# Patient Record
Sex: Male | Born: 1965 | Race: White | Hispanic: No | Marital: Married | Smoking: Never smoker
Health system: Southern US, Community
[De-identification: ages and names within clinical notes are randomized; demographics above are authoritative.]

## PROBLEM LIST (undated history)

## (undated) DIAGNOSIS — E559 Vitamin D deficiency, unspecified: Secondary | ICD-10-CM

## (undated) DIAGNOSIS — E785 Hyperlipidemia, unspecified: Secondary | ICD-10-CM

## (undated) DIAGNOSIS — E291 Testicular hypofunction: Secondary | ICD-10-CM

## (undated) DIAGNOSIS — F32A Depression, unspecified: Secondary | ICD-10-CM

## (undated) DIAGNOSIS — R7303 Prediabetes: Secondary | ICD-10-CM

## (undated) DIAGNOSIS — E039 Hypothyroidism, unspecified: Secondary | ICD-10-CM

## (undated) DIAGNOSIS — I10 Essential (primary) hypertension: Secondary | ICD-10-CM

## (undated) DIAGNOSIS — F329 Major depressive disorder, single episode, unspecified: Secondary | ICD-10-CM

## (undated) HISTORY — DX: Prediabetes: R73.03

## (undated) HISTORY — DX: Hypothyroidism, unspecified: E03.9

## (undated) HISTORY — DX: Hyperlipidemia, unspecified: E78.5

## (undated) HISTORY — DX: Depression, unspecified: F32.A

## (undated) HISTORY — DX: Testicular hypofunction: E29.1

## (undated) HISTORY — DX: Essential (primary) hypertension: I10

## (undated) HISTORY — DX: Vitamin D deficiency, unspecified: E55.9

## (undated) HISTORY — DX: Major depressive disorder, single episode, unspecified: F32.9

---

## 2004-09-17 ENCOUNTER — Encounter (HOSPITAL_COMMUNITY): Admission: RE | Admit: 2004-09-17 | Discharge: 2004-12-16 | Payer: Self-pay | Admitting: Internal Medicine

## 2005-08-27 ENCOUNTER — Emergency Department (HOSPITAL_COMMUNITY): Admission: EM | Admit: 2005-08-27 | Discharge: 2005-08-27 | Payer: Self-pay | Admitting: Emergency Medicine

## 2010-07-14 ENCOUNTER — Encounter: Payer: Self-pay | Admitting: Internal Medicine

## 2013-05-09 ENCOUNTER — Ambulatory Visit: Payer: BC Managed Care – PPO

## 2013-05-11 ENCOUNTER — Encounter: Payer: Self-pay | Admitting: Internal Medicine

## 2013-05-12 ENCOUNTER — Other Ambulatory Visit: Payer: Self-pay | Admitting: Internal Medicine

## 2013-05-12 ENCOUNTER — Encounter: Payer: Self-pay | Admitting: Internal Medicine

## 2013-05-12 ENCOUNTER — Ambulatory Visit: Payer: BC Managed Care – PPO | Admitting: Internal Medicine

## 2013-05-12 VITALS — BP 116/84 | HR 80 | Temp 97.3°F | Resp 18 | Ht 69.0 in | Wt 237.0 lb

## 2013-05-12 DIAGNOSIS — E559 Vitamin D deficiency, unspecified: Secondary | ICD-10-CM | POA: Insufficient documentation

## 2013-05-12 DIAGNOSIS — E119 Type 2 diabetes mellitus without complications: Secondary | ICD-10-CM

## 2013-05-12 DIAGNOSIS — E291 Testicular hypofunction: Secondary | ICD-10-CM

## 2013-05-12 DIAGNOSIS — E1122 Type 2 diabetes mellitus with diabetic chronic kidney disease: Secondary | ICD-10-CM | POA: Insufficient documentation

## 2013-05-12 LAB — HEMOGLOBIN A1C: Mean Plasma Glucose: 134 mg/dL — ABNORMAL HIGH (ref <117)

## 2013-05-12 MED ORDER — TESTOSTERONE CYPIONATE 200 MG/ML IM SOLN
300.0000 mg | Freq: Once | INTRAMUSCULAR | Status: AC
  Administered 2013-05-12: 300 mg via INTRAMUSCULAR

## 2013-05-12 MED ORDER — ERGOCALCIFEROL 1.25 MG (50000 UT) PO CAPS: ORAL_CAPSULE | ORAL | Status: DC

## 2013-05-12 NOTE — Progress Notes (Signed)
Patient ID: Juan Campos, male   DOB: [birth year], 47 y.o.   MRN: [id number]   This very nice 47 yo MWM presents for 1 month follow up diabetes and vitamin D deficiency.      Also, the patient has history of prediabetes/insulin resistance wi last A1c of    . Patient denies any symptoms of reactive hypoglycemia, diabetic polys, paresthesias or visual blurring.   Also, the patient's diabetes has been controlled with diet and medications. Last A1c rising from 6.1% -> 7.2% with elevated insulin level of 113 (nl<28). Patient was started on Metformin And also phentermine for weight loss and has lost from 256# -> 237 # (19 # loss!) over the last month. . Patient denies any symptoms of reactive hypoglycemia, diabetic polys, paresthesias or visual blurring.   Further, Patient has history of vitamin D deficiency with last vitamin D of 31 and was started on higher dose of Vit D2 1.25mg /da  . Patient supplements vitamin D without any suspected side-effects.  Current Outpatient Prescriptions on File Prior to Visit  Medication Sig Dispense Refill  . ALPRAZolam (XANAX) 1 MG tablet Take 1 mg by mouth at bedtime as needed for anxiety.      Marland Kitchen atenolol (TENORMIN) 100 MG tablet Take 100 mg by mouth daily.      Marland Kitchen atorvastatin (LIPITOR) 40 MG tablet Take 40 mg by mouth daily. Take 1/2 tablet daily.      Marland Kitchen buPROPion (WELLBUTRIN XL) 300 MG 24 hr tablet Take 300 mg by mouth daily.      . fenofibrate micronized (LOFIBRA) 134 MG capsule Take 134 mg by mouth daily before breakfast.       Levothyroxine 100 mcg daily      Metformin 500 mg ER  TID (1-1-2)     phenteramine 37.5 mg  1/2 to 1 tab qd                      . testosterone cypionate (DEPOTESTOTERONE CYPIONATE) 200 MG/ML injection Inject into the muscle every 21 ( twenty-one) days. 1 & 1/2 cc every 3 weeks.       No current facility-administered medications on file prior to visit.     Allergies  Allergen Reactions  . Citalopram Diarrhea  . Sertraline     PMHx:    Past Medical History  Diagnosis Date  . Hyperlipidemia   . Hypertension   . Hypothyroidism   . Depression   . Hypogonadism male   . NIDDM     FHx:    Reviewed / unchanged  SHx:    Reviewed / unchanged  Systems Review: Constitutional: Denies fever, chills, wt changes, headaches, insomnia, fatigue, night sweats, change in appetite. Eyes: Denies redness, blurred vision, diplopia, discharge, itchy, watery eyes.  ENT: Denies discharge, congestion, post nasal drip, epistaxis, sore throat, earache, hearing loss, dental pain, tinnitus, vertigo, sinus pain, snoring.  CV: Denies chest pain, palpitations, irregular heartbeat, syncope, dyspnea, diaphoresis, orthopnea, PND, claudication, edema. Respiratory: denies cough, dyspnea, DOE, pleurisy, hoarseness, laryngitis, wheezing.  Gastrointestinal: Denies dysphagia, odynophagia, heartburn, reflux, water brash, abdominal pain or cramps, nausea, vomiting, bloating, diarrhea, constipation, hematemesis, melena, hematochezia,  Hemorrhoids. Genitourinary: Denies dysuria, frequency, urgency, nocturia, hesitancy, discharge, hematuria, flank pain. Musculoskeletal: Denies arthralgias, myalgias, stiffness, jt. swelling, pain, limp, strain/sprain.  Skin: Denies pruritus, rash, hives, warts, acne, eczema, change in skin lesion(s). Neuro: No weakness, tremor, incoordination, spasms, paresthesia, or pain. Psychiatric: Denies confusion, memory loss, or sensory loss. Endo: Denies change in weight, skin,  hair change.  Heme/Lymph: No excessive bleeding, bruising, orenlarged lymph nodes.  Filed Vitals:   05/12/13 1714  BP: 116/84  Pulse: 80  Temp: 97.3 F (36.3 C)  Resp: 18    Estimated body mass index is 34.98 kg/(m^2) as calculated from the following:   Height as of this encounter: 5\' 9"  (1.753 m).   Weight as of this encounter: 237 lb (107.502 kg).  On Exam: Appears well nourished - in no distress. HEENT: WNL.  Neck: Supple. Thyroid nl. Car 2+/2+  without bruits, nodes or JVD. Chest: Respirations nl with BS clear & equal w/o rales, rhonchi, wheezing or stridor.  Cor: Heart sounds normal w/ regular rate and rhythm without sig. murmurs, gallops, clicks, or rubs. Peripheral pulses normal and equal  without edema.  Musculoskeletal: Full ROM all peripheral extremities, joint stability, 5/5 strength, and normal gait.  Skin: Warm, dry without exposed rashes, lesions, ecchymosis apparent.  Neuro: Cranial nerves intact, reflexes equal bilaterally. Sensory-motor testing grossly intact. Tendon reflexes grossly intact.  Pysch: Alert & oriented x 3. Insight and judgement nl & appropriate. No ideations.  Assessment and Plan:  1. Diabetes - continue recommend prudent low glycemic diet, weight control, regular exercise, diabetic monitoring and periodic eye exams. Continue Phentermine with continued weight loss.  2. Vitamin D Deficiency - Continue supplementation pending labs on higher dose  Further disposition pending results of labs.

## 2013-05-12 NOTE — Patient Instructions (Signed)
Vitamin D Deficiency Vitamin D is an important vitamin that your body needs. Having too little of it in your body is called a deficiency. A very bad deficiency can make your bones soft and can cause a condition called rickets.  Vitamin D is important to your body for different reasons, such as:   It helps your body absorb 2 minerals called calcium and phosphorus.  It helps make your bones healthy.  It may prevent some diseases, such as diabetes and multiple sclerosis.  It helps your muscles and heart. You can get vitamin D in several ways. It is a natural part of some foods. The vitamin is also added to some dairy products and cereals. Some people take vitamin D supplements. Also, your body makes vitamin D when you are in the sun. It changes the sun's rays into a form of the vitamin that your body can use. CAUSES   Not eating enough foods that contain vitamin D.  Not getting enough sunlight.  Having certain digestive system diseases that make it hard to absorb vitamin D. These diseases include Crohn's disease, chronic pancreatitis, and cystic fibrosis.  Having a surgery in which part of the stomach or small intestine is removed.  Being obese. Fat cells pull vitamin D out of your blood. That means that obese people may not have enough vitamin D left in their blood and in other body tissues.  Having chronic kidney or liver disease. RISK FACTORS Risk factors are things that make you more likely to develop a vitamin D deficiency. They include:  Being older.  Not being able to get outside very much.  Living in a nursing home.  Having had broken bones.  Having weak or thin bones (osteoporosis).  Having a disease or condition that changes how your body absorbs vitamin D.  Having dark skin.  Some medicines such as seizure medicines or steroids.  Being overweight or obese. SYMPTOMS Mild cases of vitamin D deficiency may not have any symptoms. If you have a very bad case, symptoms  may include:  Bone pain.  Muscle pain.  Falling often.  Broken bones caused by a minor injury, due to osteoporosis. DIAGNOSIS A blood test is the best way to tell if you have a vitamin D deficiency. TREATMENT Vitamin D deficiency can be treated in different ways. Treatment for vitamin D deficiency depends on what is causing it. Options include:  Taking vitamin D supplements.  Taking a calcium supplement. Your caregiver will suggest what dose is best for you. HOME CARE INSTRUCTIONS  Take any supplements that your caregiver prescribes. Follow the directions carefully. Take only the suggested amount.  Have your blood tested 2 months after you start taking supplements.  Eat foods that contain vitamin D. Healthy choices include:  Fortified dairy products, cereals, or juices. Fortified means vitamin D has been added to the food. Check the label on the package to be sure.  Fatty fish like salmon or trout.  Eggs.  Oysters.  Do not use a tanning bed.  Keep your weight at a healthy level. Lose weight if you need to.  Keep all follow-up appointments. Your caregiver will need to perform blood tests to make sure your vitamin D deficiency is going away. SEEK MEDICAL CARE IF:  You have any questions about your treatment.  You continue to have symptoms of vitamin D deficiency.  You have nausea or vomiting.  You are constipated.  You feel confused.  You have severe abdominal or back pain. MAKE   SURE YOU:  Understand these instructions.  Will watch your condition.  Will get help right away if you are not doing well or get worse. Document Released: 09/01/2011 Document Revised: 10/04/2012 Document Reviewed: 09/01/2011 [facility] Patient Information 2014 [place], [place]. Diabetes and Exercise Exercising regularly is important. It is not just about losing weight. It has many health benefits, such as:  Improving your overall fitness, flexibility, and endurance.  Increasing your  bone density.  Helping with weight control.  Decreasing your body fat.  Increasing your muscle strength.  Reducing stress and tension.  Improving your overall health. People with diabetes who exercise gain additional benefits because exercise:  Reduces appetite.  Improves the body's use of blood sugar (glucose).  Helps lower or control blood glucose.  Decreases blood pressure.  Helps control blood lipids (such as cholesterol and triglycerides).  Improves the body's use of the hormone insulin by:  Increasing the body's insulin sensitivity.  Reducing the body's insulin needs.  Decreases the risk for heart disease because exercising:  Lowers cholesterol and triglycerides levels.  Increases the levels of good cholesterol (such as high-density lipoproteins [HDL]) in the body.  Lowers blood glucose levels. YOUR ACTIVITY PLAN  Choose an activity that you enjoy and set realistic goals. Your health care provider or diabetes educator can help you make an activity plan that works for you. You can break activities into 2 or 3 sessions throughout the day. Doing so is as good as one long session. Exercise ideas include:  Taking the dog for a walk.  Taking the stairs instead of the elevator.  Dancing to your favorite song.  Doing your favorite exercise with a friend. RECOMMENDATIONS FOR EXERCISING WITH TYPE 1 OR TYPE 2 DIABETES   Check your blood glucose before exercising. If blood glucose levels are greater than 240 mg/dL, check for urine ketones. Do not exercise if ketones are present.  Avoid injecting insulin into areas of the body that are going to be exercised. For example, avoid injecting insulin into:  The arms when playing tennis.  The legs when jogging.  Keep a record of:  Food intake before and after you exercise.  Expected peak times of insulin action.  Blood glucose levels before and after you exercise.  The type and amount of exercise you have  done.  Review your records with your health care provider. Your health care provider will help you to develop guidelines for adjusting food intake and insulin amounts before and after exercising.  If you take insulin or oral hypoglycemic agents, watch for signs and symptoms of hypoglycemia. They include:  Dizziness.  Shaking.  Sweating.  Chills.  Confusion.  Drink plenty of water while you exercise to prevent dehydration or heat stroke. Body water is lost during exercise and must be replaced.  Talk to your health care provider before starting an exercise program to make sure it is safe for you. Remember, almost any type of activity is better than none. Document Released: 08/30/2003 Document Revised: 02/09/2013 Document Reviewed: 11/16/2012 [facility] Patient Information 2014 [place], [place].

## 2013-06-03 ENCOUNTER — Encounter: Payer: Self-pay | Admitting: Internal Medicine

## 2013-06-24 ENCOUNTER — Other Ambulatory Visit: Payer: Self-pay | Admitting: Physician Assistant

## 2013-06-24 MED ORDER — PHENTERMINE HCL 37.5 MG PO TABS: ORAL_TABLET | ORAL | Status: DC

## 2013-07-05 ENCOUNTER — Encounter: Payer: Self-pay | Admitting: Internal Medicine

## 2013-07-06 ENCOUNTER — Other Ambulatory Visit: Payer: Self-pay | Admitting: Emergency Medicine

## 2013-07-06 MED ORDER — TESTOSTERONE CYPIONATE 200 MG/ML IM SOLN: INTRAMUSCULAR | Status: DC

## 2013-07-11 ENCOUNTER — Other Ambulatory Visit: Payer: Self-pay | Admitting: Internal Medicine

## 2013-07-12 ENCOUNTER — Ambulatory Visit (INDEPENDENT_AMBULATORY_CARE_PROVIDER_SITE_OTHER): Payer: BC Managed Care – PPO | Admitting: Physician Assistant

## 2013-07-12 ENCOUNTER — Encounter: Payer: Self-pay | Admitting: Internal Medicine

## 2013-07-12 VITALS — BP 126/76 | HR 76 | Temp 96.8°F | Resp 18 | Wt 223.2 lb

## 2013-07-12 DIAGNOSIS — E785 Hyperlipidemia, unspecified: Secondary | ICD-10-CM

## 2013-07-12 DIAGNOSIS — I10 Essential (primary) hypertension: Secondary | ICD-10-CM

## 2013-07-12 DIAGNOSIS — E782 Mixed hyperlipidemia: Secondary | ICD-10-CM

## 2013-07-12 DIAGNOSIS — Z79899 Other long term (current) drug therapy: Secondary | ICD-10-CM

## 2013-07-12 DIAGNOSIS — R7309 Other abnormal glucose: Secondary | ICD-10-CM

## 2013-07-12 DIAGNOSIS — E039 Hypothyroidism, unspecified: Secondary | ICD-10-CM

## 2013-07-12 DIAGNOSIS — E119 Type 2 diabetes mellitus without complications: Secondary | ICD-10-CM

## 2013-07-12 DIAGNOSIS — E291 Testicular hypofunction: Secondary | ICD-10-CM | POA: Insufficient documentation

## 2013-07-12 DIAGNOSIS — E559 Vitamin D deficiency, unspecified: Secondary | ICD-10-CM | POA: Insufficient documentation

## 2013-07-12 MED ORDER — PHENTERMINE HCL 37.5 MG PO TABS: ORAL_TABLET | ORAL | Status: DC

## 2013-07-12 NOTE — Progress Notes (Signed)
HPI Patient presents for 3 month follow up with hypertension, hyperlipidemia, diabetes and vitamin D. Patient's blood pressure has been controlled at home, today their BP is BP: 126/76 mmHg Patient denies chest pain, shortness of breath, dizziness.  Patient's cholesterol is diet controlled. In addition they are on Lipitor and denies myalgias. The cholesterol last visit was LDL was 85, trigs 231, HDL 32.  The patient has been working on diet and exercise for Diabetes, and denies changes in vision, polys, and paresthesias. 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  Patient is on Vitamin D supplement 5000 QD.  Vitamin D 104 Patient is still on phenteramine but his weight is down another 14 lbs and he has no AEs.  Current Medications:  Current Outpatient Prescriptions on File Prior to Visit  Medication Sig Dispense Refill  . ALPRAZolam (XANAX) 1 MG tablet Take 1 mg by mouth at bedtime as needed for anxiety.      Marland Kitchen. atenolol (TENORMIN) 100 MG tablet Take 100 mg by mouth daily.      Marland Kitchen. buPROPion (WELLBUTRIN XL) 300 MG 24 hr tablet Take 300 mg by mouth daily.      . citalopram (CELEXA) 20 MG tablet       . ergocalciferol (VITAMIN D2) 50000 UNITS capsule Take 1 capsule daily for severe Vitamin D Deficiency  30 capsule  99  . fenofibrate micronized (LOFIBRA) 134 MG capsule TAKE ONE CAPSULE EVERY DAY  30 capsule  2  . levothyroxine (SYNTHROID, LEVOTHROID) 100 MCG tablet       . metFORMIN (GLUCOPHAGE-XR) 500 MG 24 hr tablet Take 500 mg by mouth 4 (four) times daily - after meals and at bedtime. Takes 1 at breakfast, 1 at lunch, and 2 at dinner      . phentermine (ADIPEX-P) 37.5 MG tablet TAKE 1/2 TO 1 TABLET EVERY MORNING  30 tablet  0  . testosterone cypionate (DEPOTESTOTERONE CYPIONATE) 200 MG/ML injection Inject 2cc every 2 weeks IM  10 mL  1   No current facility-administered medications on file prior to visit.   Medical History:  Past Medical History  Diagnosis Date  .  Hyperlipidemia   . Hypertension   . Hypothyroidism   . Depression   . Hypogonadism male   . Pre-diabetes   . Vitamin D deficiency    Allergies:  Allergies  Allergen Reactions  . Citalopram Diarrhea  . Sertraline     ROS Constitutional: Denies fever, chills, headaches, insomnia, fatigue, night sweats Eyes: Denies redness, blurred vision, diplopia, discharge, itchy, watery eyes.  ENT: Denies congestion, post nasal drip, sore throat, earache, dental pain, Tinnitus, Vertigo, Sinus pain, snoring.  Cardio: Denies chest pain, palpitations, irregular heartbeat, dyspnea, diaphoresis, orthopnea, PND, claudication, edema Respiratory: denies cough, shortness of breath, wheezing.  Gastrointestinal: Denies dysphagia, heartburn, AB pain/ cramps, N/V, diarrhea, constipation, hematemesis, melena, hematochezia,  hemorrhoids Genitourinary: Denies dysuria, frequency, urgency, nocturia, hesitancy, discharge, hematuria, flank pain Musculoskeletal: Denies myalgia, stiffness, pain, swelling and strain/sprain. Skin: Denies pruritis, rash, changing in skin lesion Neuro: Denies Weakness, tremor, incoordination, spasms, pain Psychiatric: Denies confusion, memory loss, sensory loss Endocrine: Denies change in weight, skin, hair change, nocturia Diabetic Polys, Denies visual blurring, hyper /hypo glycemic episodes, and paresthesia, Heme/Lymph: Denies Excessive bleeding, bruising, enlarged lymph nodes  Family history- Review and unchanged Social history- Review and unchanged Physical Exam: Filed Vitals:   07/12/13 1650  BP: 126/76  Pulse: 76  Temp: 96.8 F (36 C)  Resp: 18  Filed Weights   07/12/13 1650  Weight: 223 lb 3.2 oz (101.243 kg)   General Appearance: Well nourished, in no apparent distress. Eyes: PERRLA, EOMs, conjunctiva no swelling or erythema Sinuses: No Frontal/maxillary tenderness ENT/Mouth: Ext aud canals clear, TMs without erythema, bulging. No erythema, swelling, or exudate on  post pharynx.  Tonsils not swollen or erythematous. Hearing normal.  Neck: Supple, thyroid normal.  Respiratory: Respiratory effort normal, BS equal bilaterally without rales, rhonchi, wheezing or stridor.  Cardio: RRR with no MRGs. Brisk peripheral pulses without edema.  Abdomen: Soft, + BS.  Non tender, no guarding, rebound, hernias, masses. Lymphatics: Non tender without lymphadenopathy.  Musculoskeletal: Full ROM, 5/5 strength, normal gait.  Skin: Warm, dry without rashes, lesions, ecchymosis.  Neuro: Cranial nerves intact. No cerebellar symptoms. Sensation intact.  Psych: Awake and oriented X 3, normal affect, Insight and Judgment appropriate.   Assessment and Plan:  Hypertension: Continue medication, monitor blood pressure at home.  Continue DASH diet. Cholesterol: Continue diet and exercise. Check cholesterol.  Diabetes-Continue diet and exercise. 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  Vitamin D Def- check level and continue medications.  Obesity- weight loss- phenteramine 37.5mg    Continue diet and meds as discussed. Further disposition pending results of labs. Discussed med's effects and SE's.    Quentin Mulling 5:11 PM

## 2013-07-12 NOTE — Patient Instructions (Signed)
   Bad carbs also include fruit juice, alcohol, and sweet tea. These are empty calories that do not signal to your brain that you are full.   Please remember the good carbs are still carbs which convert into sugar. So please measure them out no more than 1/2-1 cup of rice, oatmeal, pasta, and beans.  Veggies are however free foods! Pile them on.   I like lean protein at every meal such as chicken, turkey, pork chops, cottage cheese, etc. Just do not fry these meats and please center your meal around vegetable, the meats should be a side dish.   No all fruit is created equal. Please see the list below, the fruit at the bottom is higher in sugars than the fruit at the top   Remember exercise is great for your cardiovascular health and can help with weight loss but YOU CAN NOT OUT RUN YOUR FORK!     

## 2013-07-13 LAB — CBC WITH DIFFERENTIAL/PLATELET
BASOS PCT: 1 % (ref 0–1)
Basophils Absolute: 0.1 10*3/uL (ref 0.0–0.1)
EOS ABS: 0.2 10*3/uL (ref 0.0–0.7)
Eosinophils Relative: 2 % (ref 0–5)
HEMATOCRIT: 43.8 % (ref 39.0–52.0)
HEMOGLOBIN: 14.5 g/dL (ref 13.0–17.0)
Lymphocytes Relative: 22 % (ref 12–46)
Lymphs Abs: 1.9 10*3/uL (ref 0.7–4.0)
MCH: 28.2 pg (ref 26.0–34.0)
MCHC: 33.1 g/dL (ref 30.0–36.0)
MCV: 85 fL (ref 78.0–100.0)
Monocytes Absolute: 0.6 10*3/uL (ref 0.1–1.0)
Monocytes Relative: 8 % (ref 3–12)
NEUTROS ABS: 5.7 10*3/uL (ref 1.7–7.7)
NEUTROS PCT: 67 % (ref 43–77)
PLATELETS: 331 10*3/uL (ref 150–400)
RBC: 5.15 MIL/uL (ref 4.22–5.81)
RDW: 13 % (ref 11.5–15.5)
WBC: 8.4 10*3/uL (ref 4.0–10.5)

## 2013-07-13 LAB — BASIC METABOLIC PANEL WITH GFR
BUN: 17 mg/dL (ref 6–23)
CHLORIDE: 100 meq/L (ref 96–112)
CO2: 26 meq/L (ref 19–32)
Calcium: 10 mg/dL (ref 8.4–10.5)
Creat: 1.27 mg/dL (ref 0.50–1.35)
GFR, Est African American: 78 mL/min
GFR, Est Non African American: 67 mL/min
GLUCOSE: 82 mg/dL (ref 70–99)
POTASSIUM: 4.5 meq/L (ref 3.5–5.3)
SODIUM: 139 meq/L (ref 135–145)

## 2013-07-13 LAB — HEMOGLOBIN A1C
Hgb A1c MFr Bld: 5.9 % — ABNORMAL HIGH (ref <5.7)
MEAN PLASMA GLUCOSE: 123 mg/dL — AB (ref <117)

## 2013-07-13 LAB — HEPATIC FUNCTION PANEL
ALK PHOS: 47 U/L (ref 39–117)
ALT: 15 U/L (ref 0–53)
AST: 19 U/L (ref 0–37)
Albumin: 4.7 g/dL (ref 3.5–5.2)
BILIRUBIN DIRECT: 0.1 mg/dL (ref 0.0–0.3)
BILIRUBIN INDIRECT: 0.4 mg/dL (ref 0.0–0.9)
Total Bilirubin: 0.5 mg/dL (ref 0.3–1.2)
Total Protein: 7.2 g/dL (ref 6.0–8.3)

## 2013-07-13 LAB — LIPID PANEL
Cholesterol: 106 mg/dL (ref 0–200)
HDL: 22 mg/dL — AB (ref >39)
LDL CALC: 57 mg/dL (ref 0–99)
TRIGLYCERIDES: 137 mg/dL (ref <150)
Total CHOL/HDL Ratio: 4.8 Ratio
VLDL: 27 mg/dL (ref 0–40)

## 2013-07-13 LAB — MAGNESIUM: Magnesium: 1.9 mg/dL (ref 1.5–2.5)

## 2013-07-13 LAB — TSH: TSH: 2.329 u[IU]/mL (ref 0.350–4.500)

## 2013-07-13 LAB — INSULIN, FASTING: Insulin fasting, serum: 16 u[IU]/mL (ref 3–28)

## 2013-07-13 LAB — VITAMIN D 25 HYDROXY (VIT D DEFICIENCY, FRACTURES): Vit D, 25-Hydroxy: >120 ng/mL — ABNORMAL HIGH (ref 30–89)

## 2013-08-22 ENCOUNTER — Encounter: Payer: Self-pay | Admitting: Internal Medicine

## 2013-09-05 ENCOUNTER — Other Ambulatory Visit: Payer: Self-pay | Admitting: Internal Medicine

## 2013-09-05 ENCOUNTER — Encounter: Payer: Self-pay | Admitting: Internal Medicine

## 2013-09-05 DIAGNOSIS — F411 Generalized anxiety disorder: Secondary | ICD-10-CM

## 2013-09-05 MED ORDER — ALPRAZOLAM 1 MG PO TABS: ORAL_TABLET | ORAL | Status: DC

## 2013-09-29 ENCOUNTER — Other Ambulatory Visit: Payer: Self-pay | Admitting: Internal Medicine

## 2013-10-12 ENCOUNTER — Ambulatory Visit: Payer: Self-pay | Admitting: Internal Medicine

## 2013-10-26 ENCOUNTER — Other Ambulatory Visit: Payer: Self-pay | Admitting: Internal Medicine

## 2013-11-05 ENCOUNTER — Other Ambulatory Visit: Payer: Self-pay | Admitting: Internal Medicine

## 2013-11-25 ENCOUNTER — Other Ambulatory Visit: Payer: Self-pay | Admitting: Internal Medicine

## 2013-12-06 ENCOUNTER — Ambulatory Visit (INDEPENDENT_AMBULATORY_CARE_PROVIDER_SITE_OTHER): Payer: BC Managed Care – PPO | Admitting: Internal Medicine

## 2013-12-06 ENCOUNTER — Encounter: Payer: Self-pay | Admitting: Internal Medicine

## 2013-12-06 VITALS — BP 138/86 | HR 64 | Temp 98.1°F | Resp 16 | Ht 68.5 in | Wt 238.8 lb

## 2013-12-06 DIAGNOSIS — Z125 Encounter for screening for malignant neoplasm of prostate: Secondary | ICD-10-CM

## 2013-12-06 DIAGNOSIS — R7303 Prediabetes: Secondary | ICD-10-CM

## 2013-12-06 DIAGNOSIS — Z111 Encounter for screening for respiratory tuberculosis: Secondary | ICD-10-CM

## 2013-12-06 DIAGNOSIS — Z1211 Encounter for screening for malignant neoplasm of colon: Secondary | ICD-10-CM

## 2013-12-06 DIAGNOSIS — Z0000 Encounter for general adult medical examination without abnormal findings: Secondary | ICD-10-CM

## 2013-12-06 DIAGNOSIS — Z1159 Encounter for screening for other viral diseases: Secondary | ICD-10-CM

## 2013-12-06 LAB — CBC WITH DIFFERENTIAL/PLATELET
BASOS PCT: 1 % (ref 0–1)
Basophils Absolute: 0.1 10*3/uL (ref 0.0–0.1)
EOS ABS: 0.1 10*3/uL (ref 0.0–0.7)
EOS PCT: 1 % (ref 0–5)
HCT: 40 % (ref 39.0–52.0)
Hemoglobin: 13.8 g/dL (ref 13.0–17.0)
Lymphocytes Relative: 23 % (ref 12–46)
Lymphs Abs: 1.6 10*3/uL (ref 0.7–4.0)
MCH: 28.9 pg (ref 26.0–34.0)
MCHC: 34.5 g/dL (ref 30.0–36.0)
MCV: 83.9 fL (ref 78.0–100.0)
MONO ABS: 0.6 10*3/uL (ref 0.1–1.0)
MONOS PCT: 8 % (ref 3–12)
NEUTROS ABS: 4.6 10*3/uL (ref 1.7–7.7)
Neutrophils Relative %: 67 % (ref 43–77)
PLATELETS: 332 10*3/uL (ref 150–400)
RBC: 4.77 MIL/uL (ref 4.22–5.81)
RDW: 14.3 % (ref 11.5–15.5)
WBC: 6.9 10*3/uL (ref 4.0–10.5)

## 2013-12-06 MED ORDER — PHENTERMINE HCL 37.5 MG PO TABS: ORAL_TABLET | ORAL | Status: DC

## 2013-12-06 NOTE — Patient Instructions (Signed)
Preventative Care for Adults, Male       REGULAR HEALTH EXAMS:  A routine yearly physical is a good way to check in with your primary care provider about your health and preventive screening. It is also an opportunity to share updates about your health and any concerns you have, and receive a thorough all-over exam.   Most health insurance companies pay for at least some preventative services.  Check with your health plan for specific coverages.  WHAT PREVENTATIVE SERVICES DO MEN NEED?  Adult men should have their weight and blood pressure checked regularly.   Men age 35 and older should have their cholesterol levels checked regularly.  Beginning at age 50 and continuing to age 75, men should be screened for colorectal cancer.  Certain people should may need continued testing until age 85.  Other cancer screening may include exams for testicular and prostate cancer.  Updating vaccinations is part of preventative care.  Vaccinations help protect against diseases such as the flu.  Lab tests are generally done as part of preventative care to screen for anemia and blood disorders, to screen for problems with the kidneys and liver, to screen for bladder problems, to check blood sugar, and to check your cholesterol level.  Preventative services generally include counseling about diet, exercise, avoiding tobacco, drugs, excessive alcohol consumption, and sexually transmitted infections.    GENERAL RECOMMENDATIONS FOR GOOD HEALTH:  Healthy diet:  Eat a variety of foods, including fruit, vegetables, animal or vegetable protein, such as meat, fish, chicken, and eggs, or beans, lentils, tofu, and grains, such as rice.  Drink plenty of water daily.  Decrease saturated fat in the diet, avoid lots of red meat, processed foods, sweets, fast foods, and fried foods.  Exercise:  Aerobic exercise helps maintain good heart health. At least 30-40 minutes of moderate-intensity exercise is recommended.  For example, a brisk walk that increases your heart rate and breathing. This should be done on most days of the week.   Find a type of exercise or a variety of exercises that you enjoy so that it becomes a part of your daily life.  Examples are running, walking, swimming, water aerobics, and biking.  For motivation and support, explore group exercise such as aerobic class, spin class, Zumba, Yoga,or  martial arts, etc.    Set exercise goals for yourself, such as a certain weight goal, walk or run in a race such as a 5k walk/run.  Speak to your primary care provider about exercise goals.  Disease prevention:  If you smoke or chew tobacco, find out from your caregiver how to quit. It can literally save your life, no matter how long you have been a tobacco user. If you do not use tobacco, never begin.   Maintain a healthy diet and normal weight. Increased weight leads to problems with blood pressure and diabetes.   The Body Mass Index or BMI is a way of measuring how much of your body is fat. Having a BMI above 27 increases the risk of heart disease, diabetes, hypertension, stroke and other problems related to obesity. Your caregiver can help determine your BMI and based on it develop an exercise and dietary program to help you achieve or maintain this important measurement at a healthful level.  High blood pressure causes heart and blood vessel problems.  Persistent high blood pressure should be treated with medicine if weight loss and exercise do not work.   Fat and cholesterol leaves deposits in your arteries   that can block them. This causes heart disease and vessel disease elsewhere in your body.  If your cholesterol is found to be high, or if you have heart disease or certain other medical conditions, then you may need to have your cholesterol monitored frequently and be treated with medication.   Ask if you should have a stress test if your history suggests this. A stress test is a test done on  a treadmill that looks for heart disease. This test can find disease prior to there being a problem.  Avoid drinking alcohol in excess (more than two drinks per day).  Avoid use of street drugs. Do not share needles with anyone. Ask for professional help if you need assistance or instructions on stopping the use of alcohol, cigarettes, and/or drugs.  Brush your teeth twice a day with fluoride toothpaste, and floss once a day. Good oral hygiene prevents tooth decay and gum disease. The problems can be painful, unattractive, and can cause other health problems. Visit your dentist for a routine oral and dental check up and preventive care every 6-12 months.   Look at your skin regularly.  Use a mirror to look at your back. Notify your caregivers of changes in moles, especially if there are changes in shapes, colors, a size larger than a pencil eraser, an irregular border, or development of new moles.  Safety:  Use seatbelts 100% of the time, whether driving or as a passenger.  Use safety devices such as hearing protection if you work in environments with loud noise or significant background noise.  Use safety glasses when doing any work that could send debris in to the eyes.  Use a helmet if you ride a bike or motorcycle.  Use appropriate safety gear for contact sports.  Talk to your caregiver about gun safety.  Use sunscreen with a SPF (or skin protection factor) of 15 or greater.  Lighter skinned people are at a greater risk of skin cancer. Don't forget to also wear sunglasses in order to protect your eyes from too much damaging sunlight. Damaging sunlight can accelerate cataract formation.   Practice safe sex. Use condoms. Condoms are used for birth control and to help reduce the spread of sexually transmitted infections (or STIs).  Some of the STIs are gonorrhea (the clap), chlamydia, syphilis, trichomonas, herpes, HPV (human papilloma virus) and HIV (human immunodeficiency virus) which causes AIDS.  The herpes, HIV and HPV are viral illnesses that have no cure. These can result in disability, cancer and death.   Keep carbon monoxide and smoke detectors in your home functioning at all times. Change the batteries every 6 months or use a model that plugs into the wall.   Vaccinations:  Stay up to date with your tetanus shots and other required immunizations. You should have a booster for tetanus every 10 years. Be sure to get your flu shot every year, since 5%-20% of the [place] population comes down with the flu. The flu vaccine changes each year, so being vaccinated once is not enough. Get your shot in the fall, before the flu season peaks.   Other vaccines to consider:  Pneumococcal vaccine to protect against certain types of pneumonia.  This is normally recommended for adults age 65 or older.  However, adults younger than 46 years old with certain underlying conditions such as diabetes, heart or lung disease should also receive the vaccine.  Shingles vaccine to protect against Varicella Zoster if you are older than age 60, or younger   than 46 years old with certain underlying illness.  Hepatitis A vaccine to protect against a form of infection of the liver by a virus acquired from food.  Hepatitis B vaccine to protect against a form of infection of the liver by a virus acquired from blood or body fluids, particularly if you work in health care.  If you plan to travel internationally, check with your local health department for specific vaccination recommendations.  Cancer Screening:  Most routine colon cancer screening begins at the age of 50. On a yearly basis, doctors may provide special easy to use take-home tests to check for hidden blood in the stool. Sigmoidoscopy or colonoscopy can detect the earliest forms of colon cancer and is life saving. These tests use a small camera at the end of a tube to directly examine the colon. Speak to your caregiver about this at age 50, when routine  screening begins (and is repeated every 5 years unless early forms of pre-cancerous polyps or small growths are found).   At the age of 50 men usually start screening for prostate cancer every year. Screening may begin at a younger age for those with higher risk. Those at higher risk include African-Americans or having a family history of prostate cancer. There are two types of tests for prostate cancer:   Prostate-specific antigen (PSA) testing. Recent studies raise questions about prostate cancer using PSA and you should discuss this with your caregiver.   Digital rectal exam (in which your doctor's lubricated and gloved finger feels for enlargement of the prostate through the anus).   Screening for testicular cancer.  Do a monthly exam of your testicles. Gently roll each testicle between your thumb and fingers, feeling for any abnormal lumps. The best time to do this is after a hot shower or bath when the tissues are looser. Notify your caregivers of any lumps, tenderness or changes in size or shape immediately.     

## 2013-12-06 NOTE — Progress Notes (Signed)
Annual Screening Comprehensive Examination  This very nice 48 y.o.  male presents for complete physical.  Patient has been followed for HTN, Diabetes  Prediabetes, Hyperlipidemia, and Vitamin D Deficiency.   HTN predates since     . Patient's BP has been controlled at home.Today's  . Patient denies any cardiac symptoms as chest pain, palpitations, shortness of breath, dizziness or ankle swelling.   Patient's hyperlipidemia is controlled with diet and medications, he is on Lipitor and fenofibrate. Patient denies myalgias or other medication SE's. Cholesterol last visit was: Lab Results  Component Value Date   CHOL 106 07/12/2013   HDL 22* 07/12/2013   LDLCALC 57 07/12/2013   TRIG 137 07/12/2013   CHOLHDL 4.8 07/12/2013    Patient has prediabetes/insulin resistance. Patient denies reactive hypoglycemic symptoms, visual blurring, diabetic polys, or paresthesias. Last A1C was: Lab Results  Component Value Date   HGBA1C 5.9* 07/12/2013    Finally, patient has history of Vitamin D Deficiency with last vitamin D 103.     He is has a history of testosterone deficiency and is on testosterone replacement. He states that the testosterone helps with his energy, libido, muscle mass.    Patient has been on phentermine for obesity with co morbidities, he denies palpitations, SOB,CP.  Wt Readings from Last 3 Encounters:  07/12/13 223 lb 3.2 oz (101.243 kg)  05/12/13 237 lb (107.502 kg)           Medication List       This list is accurate as of: 12/06/13  8:50 AM.  Always use your most recent med list.               ALPRAZolam 1 MG tablet  Commonly known as:  XANAX  Take 1/2 to 1 tablet         2  or 3  X Daily as needed for anxiety or sleep     atenolol 100 MG tablet  Commonly known as:  TENORMIN  Take 100 mg by mouth daily.     atorvastatin 80 MG tablet  Commonly known as:  LIPITOR  TAKE 1 TABLET EVERY DAY     buPROPion 300 MG 24 hr tablet  Commonly known as:  WELLBUTRIN XL  TAKE  1 TABLET BY MOUTH DAILY     citalopram 20 MG tablet  Commonly known as:  CELEXA  TAKE 1/2 TO 1 TABLET BY MOUTH EVERY DAY     ergocalciferol 50000 UNITS capsule  Commonly known as:  VITAMIN D2  Take 1 capsule daily for severe Vitamin D Deficiency     fenofibrate micronized 134 MG capsule  Commonly known as:  LOFIBRA  TAKE ONE CAPSULE EVERY DAY     levothyroxine 100 MCG tablet  Commonly known as:  SYNTHROID, LEVOTHROID     metFORMIN 500 MG 24 hr tablet  Commonly known as:  GLUCOPHAGE-XR  Take 500 mg by mouth 4 (four) times daily - after meals and at bedtime. Takes 1 at breakfast, 1 at lunch, and 2 at dinner     phentermine 37.5 MG tablet  Commonly known as:  ADIPEX-P  TAKE 1/2 TO 1 TABLET EVERY MORNING     testosterone cypionate 200 MG/ML injection  Commonly known as:  DEPOTESTOTERONE CYPIONATE  Inject 2cc every 2 weeks IM        Allergies  Allergen Reactions  . Citalopram Diarrhea  . Sertraline     Past Medical History  Diagnosis Date  . Hyperlipidemia   . Hypertension   .  Hypothyroidism   . Depression   . Hypogonadism male   . Pre-diabetes   . Vitamin D deficiency     No past surgical history on file.  Family History  Problem Relation Age of Onset  . Hyperlipidemia Mother   . Hypertension Mother   . Hypertension Sister   . Colon cancer Maternal Grandmother     History   Social History  . Marital Status: Single    Spouse Name: N/A    Number of Children: N/A  . Years of Education: N/A   Occupational History  . Not on file.   Social History Main Topics  . Smoking status: Never Smoker   . Smokeless tobacco: Never Used  . Alcohol Use: No  . Drug Use: No  . Sexual Activity: Yes   Other Topics Concern  . Not on file   Social History Narrative  . No narrative on file   Immunization History  Administered Date(s) Administered  . Pneumococcal Polysaccharide-23 11/23/2008  . Tdap 12/02/2012     ROS Constitutional: Denies fever, chills,  weight loss/gain, headaches, insomnia, fatigue, night sweats, and change in appetite. Eyes: Denies redness, blurred vision, diplopia, discharge, itchy, watery eyes.  ENT: Denies discharge, congestion, post nasal drip, epistaxis, sore throat, earache, hearing loss, dental pain, Tinnitus, Vertigo, Sinus pain, snoring.  Cardio: Denies chest pain, palpitations, irregular heartbeat, syncope, dyspnea, diaphoresis, orthopnea, PND, claudication, edema Respiratory: denies cough, dyspnea, DOE, pleurisy, hoarseness, laryngitis, wheezing.  Gastrointestinal: Denies dysphagia, heartburn, reflux, water brash, pain, cramps, nausea, vomiting, bloating, diarrhea, constipation, hematemesis, melena, hematochezia, jaundice, hemorrhoids Genitourinary: Denies dysuria, frequency, urgency, nocturia, hesitancy, discharge, hematuria, flank pain Musculoskeletal: Denies arthralgia, myalgia, stiffness, Jt. Swelling, pain, limp, and strain/sprain. Skin: Denies puritis, rash, hives, warts, acne, eczema, changing in skin lesion Neuro: No weakness, tremor, incoordination, spasms, paresthesia, pain Psychiatric: Denies confusion, memory loss, sensory loss Endocrine: Denies change in weight, skin, hair change, nocturia, and paresthesia, diabetic polys, visual blurring, hyper / hypo glycemic episodes.  Heme/Lymph: No excessive bleeding, bruising, or elarged lymph nodes.  There were no vitals filed for this visit.  Estimated body mass index is 32.95 kg/(m^2) as calculated from the following:   Height as of 05/12/13: 5\' 9"  (1.753 m).   Weight as of 07/12/13: 223 lb 3.2 oz (101.243 kg).  Physical Exam General Appearance: Well nourished, in no apparent distress. Eyes: PERRLA, EOMs, conjunctiva no swelling or erythema, normal fundi and vessels. Sinuses: No frontal/maxillary tenderness ENT/Mouth: EACs patent / TMs  nl. Nares clear without erythema, swelling, mucoid exudates. Oral hygiene is good. No erythema, swelling, or exudate.  Tongue normal, non-obstructing. Tonsils not swollen or erythematous. Hearing normal.  Neck: Supple, thyroid normal. No bruits, nodes or JVD. Respiratory: Respiratory effort normal.  BS equal and clear bilateral without rales, rhonci, wheezing or stridor. Cardio: Heart sounds are normal with regular rate and rhythm and no murmurs, rubs or gallops. Peripheral pulses are normal and equal bilaterally without edema. No aortic or femoral bruits. Chest: symmetric with normal excursions and percussion.  Abdomen: Flat, soft, with bowl sounds. Nontender, no guarding, rebound, hernias, masses, or organomegaly.  Lymphatics: Non tender without lymphadenopathy.  Genitourinary: No hernias.Testes nl. DRE - prostate nl for age - smooth & firm w/o nodules. Musculoskeletal: Full ROM all peripheral extremities, joint stability, 5/5 strength, and normal gait. Skin: Warm and dry without rashes, lesions, cyanosis, clubbing or  ecchymosis.  Neuro: Cranial nerves intact, reflexes equal bilaterally. Normal muscle tone, no cerebellar symptoms. Sensation intact.  Pysch: Awake and  oriented X 3, normal affect, insight and judgment appropriate.   Assessment and Plan  1. Annual Screening Examination 2. Hypertension  3. Hyperlipidemia 4. Pre Diabetes 5. Vitamin D Deficiency  Continue prudent diet as discussed, weight control, BP monitoring, regular exercise, and medications as discussed.  Discussed med effects and SE's. Routine screening labs and tests as requested with regular follow-up as recommended.

## 2013-12-06 NOTE — Progress Notes (Signed)
Patient ID: Juan CaffeyMark Woodyard, male   DOB: 05/04/1966, 48 y.o.   MRN: [id number]   Annual Screening Comprehensive Examination  This very nice 48 y.o.MWM presents for complete physical.  Patient has been followed for HTN, Prediabetes, Hypothyroidism, Depression,  Hyperlipidemia, and Vitamin D Deficiency.   HTN predates since 2003. Patient's BP has been controlled at home.Today's BP: 138/86 mmHg. Patient denies any cardiac symptoms as chest pain, palpitations, shortness of breath, dizziness or ankle swelling.   Patient's hyperlipidemia is controlled with diet and medications. Patient denies myalgias or other medication SE's. Last lipids in Jan 2015 as below at goal.   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 Patient Has not moniutored CBG's and has been off of his Metformin x 1 month. . Patient denies reactive hypoglycemic symptoms, visual blurring, diabetic polys or paresthesias.    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  Medication Sig  . ALPRAZolam  1 MG tablet Take 1/2 to 1 tablet 2  or 3  X Daily as needed for anxiety or sleep  . atenolol  100 MG tablet Take 100 mg by mouth daily.  Marland Kitchen. atorvastatin 80 MG tablet TAKE 1 TABLET EVERY DAY  . buPROPion  XL 300 MG 24 hr tablet TAKE 1 TABLET BY MOUTH DAILY  . citalopram (CELEXA) 20 MG tablet TAKE 1/2 TO 1 TABLET BY MOUTH EVERY DAY  . VITAMIN D2 50,000 UNITS cap Take 1 capsule daily for severe Vitamin D Deficiency  . fenofibrate  134 MG capsule TAKE ONE CAPSULE EVERY DAY  . levothyroxine  100 MCG tablet Take 100 mcg  by mouth daily. Takes at night  . metFORMIN -XR 500 MG 24 hr tablet Take 500 mg by mouth 4 (four) times daily - off x 1 + mo  .  DEPOTESTOTERONE CYPIONATE 200  Inject 2cc every 2 weeks IM - off x 4-6 mo   Allergies  Allergen Reactions  . Citalopram Diarrhea  . Sertraline    Past Medical History  Diagnosis Date  . Hyperlipidemia   . Hypertension   . Hypothyroidism   . Depression   . Hypogonadism male   . Pre-diabetes   . Vitamin D deficiency    No past surgical history on file.  Family History  Problem Relation Age of Onset  . Hyperlipidemia Mother   . Hypertension Mother   . Hypertension Sister   . Colon cancer Maternal Grandmother    History   Social History  . Marital Status: M x 8 yrs    Spouse Name: N/A    Number of Children: N/A  . Years of Education: N/A   Occupational History  . Works in  a [employer]   Social History Main Topics  . Smoking status: Never Smoker   . Smokeless tobacco: Never Used  . Alcohol Use: No  . Drug Use: No  . Sexual Activity: Yes    ROS Constitutional: Denies fever, chills, weight loss/gain, headaches, insomnia, fatigue, night sweats  or change in appetite. Eyes: Denies redness, blurred vision, diplopia, discharge, itchy or watery eyes.  ENT: Denies discharge, congestion, post nasal drip, epistaxis, sore throat, earache, hearing loss, dental pain, Tinnitus, Vertigo, Sinus pain or snoring.  Cardio: Denies chest pain, palpitations, irregular heartbeat, syncope, dyspnea, diaphoresis, orthopnea, PND, claudication or edema Respiratory: denies cough, dyspnea, DOE, pleurisy, hoarseness, laryngitis or wheezing.  Gastrointestinal: Denies dysphagia, heartburn, reflux, water brash, pain, cramps, nausea, vomiting, bloating, diarrhea, constipation, hematemesis, melena, hematochezia, jaundice or hemorrhoids Genitourinary: Denies dysuria, frequency, urgency, nocturia, hesitancy, discharge, hematuria or flank pain Musculoskeletal: Denies arthralgia,  myalgia, stiffness, Jt. Swelling, pain, limp or strain/sprain. Skin: Denies puritis, rash, hives, warts, acne, eczema or change in skin lesion Neuro: No weakness, tremor, incoordination, spasms, paresthesia or pain Psychiatric: Denies confusion, memory loss or sensory loss Endocrine: Denies change in weight, skin, hair change, nocturia, and paresthesia, diabetic polys, visual blurring or hyper / hypo glycemic episodes.  Heme/Lymph: No excessive bleeding, bruising or enlarged lymph nodes.  Physical Exam  BP 138/86  Pulse 64  Temp 98.1 F   Resp 16  Ht 5' 8.5"   Wt 238 lb 12.8 oz   BMI 35.78 kg/m2  General Appearance: Well nourished, in no apparent distress. Eyes: PERRLA, EOMs, conjunctiva no swelling or erythema, normal fundi and vessels. Sinuses: No frontal/maxillary tenderness ENT/Mouth: EACs patent / TMs  nl. Nares clear without erythema, swelling, mucoid exudates. Oral hygiene is good. No erythema, swelling, or exudate. Tongue normal, non-obstructing. Tonsils not swollen or erythematous. Hearing normal.  Neck: Supple, thyroid normal. No bruits, nodes or JVD. Respiratory: Respiratory effort normal.  BS equal and clear bilateral without rales, rhonci, wheezing or stridor. Cardio: Heart sounds are normal with regular rate and rhythm and no murmurs, rubs or gallops. Peripheral pulses are normal and equal bilaterally without edema. No aortic or femoral bruits. Chest: symmetric with normal excursions and percussion.  Abdomen: Flat, soft, with bowl sounds. Nontender, no guarding, rebound, hernias, masses, or organomegaly.  Lymphatics: Non tender without lymphadenopathy.  Genitourinary: No hernias.Testes nl. DRE - prostate nl for age - smooth & firm w/o nodules. Musculoskeletal: Full ROM all peripheral extremities, joint stability, 5/5 strength, and normal gait. Skin: Warm and dry without rashes, lesions, cyanosis, clubbing or  ecchymosis.  Neuro: Cranial nerves intact, reflexes equal  bilaterally. Normal muscle tone, no cerebellar symptoms. Sensation intact.  Pysch: Awake and oriented X 3, normal affect, insight and judgment appropriate.   Assessment and Plan  1. Annual Screening Examination 2. Hypertension  3. Hyperlipidemia 4. Pre Diabetes 5. Vitamin D Deficiency  Continue prudent diet as discussed, weight control, BP monitoring, regular exercise, and medications as discussed.  Discussed med effects and SE's. Routine screening labs and tests as requested with regular follow-up as recommended.  Long discussion today about control of diabetes and compliance  qand negative consequences of poor control.

## 2013-12-07 LAB — BASIC METABOLIC PANEL WITH GFR
BUN: 16 mg/dL (ref 6–23)
CALCIUM: 9.7 mg/dL (ref 8.4–10.5)
CHLORIDE: 102 meq/L (ref 96–112)
CO2: 28 mEq/L (ref 19–32)
Creat: 1.42 mg/dL — ABNORMAL HIGH (ref 0.50–1.35)
GFR, EST AFRICAN AMERICAN: 68 mL/min
GFR, EST NON AFRICAN AMERICAN: 59 mL/min — AB
GLUCOSE: 84 mg/dL (ref 70–99)
POTASSIUM: 4.4 meq/L (ref 3.5–5.3)
SODIUM: 139 meq/L (ref 135–145)

## 2013-12-07 LAB — TSH: TSH: 2.42 u[IU]/mL (ref 0.350–4.500)

## 2013-12-07 LAB — MICROALBUMIN / CREATININE URINE RATIO
Creatinine, Urine: 56.4 mg/dL
Microalb Creat Ratio: 8.9 mg/g (ref 0.0–30.0)
Microalb, Ur: 0.5 mg/dL (ref 0.00–1.89)

## 2013-12-07 LAB — LIPID PANEL
Cholesterol: 170 mg/dL (ref 0–200)
HDL: 36 mg/dL — AB (ref >39)
LDL CALC: 85 mg/dL (ref 0–99)
TRIGLYCERIDES: 246 mg/dL — AB (ref <150)
Total CHOL/HDL Ratio: 4.7 Ratio
VLDL: 49 mg/dL — ABNORMAL HIGH (ref 0–40)

## 2013-12-07 LAB — HEPATITIS C ANTIBODY: HCV AB: NEGATIVE

## 2013-12-07 LAB — HEPATITIS A ANTIBODY, TOTAL: HEP A TOTAL AB: NONREACTIVE

## 2013-12-07 LAB — IRON AND TIBC
%SAT: 32 % (ref 20–55)
Iron: 126 ug/dL (ref 42–165)
TIBC: 389 ug/dL (ref 215–435)
UIBC: 263 ug/dL (ref 125–400)

## 2013-12-07 LAB — URINALYSIS, ROUTINE W REFLEX MICROSCOPIC
BILIRUBIN URINE: NEGATIVE
GLUCOSE, UA: NEGATIVE mg/dL
HGB URINE DIPSTICK: NEGATIVE
KETONES UR: NEGATIVE mg/dL
Leukocytes, UA: NEGATIVE
Nitrite: NEGATIVE
PROTEIN: NEGATIVE mg/dL
Specific Gravity, Urine: 1.006 (ref 1.005–1.030)
UROBILINOGEN UA: 0.2 mg/dL (ref 0.0–1.0)
pH: 6.5 (ref 5.0–8.0)

## 2013-12-07 LAB — INSULIN, FASTING: INSULIN FASTING, SERUM: 92 u[IU]/mL — AB (ref 3–28)

## 2013-12-07 LAB — HEPATIC FUNCTION PANEL
ALT: 22 U/L (ref 0–53)
AST: 22 U/L (ref 0–37)
Albumin: 4.7 g/dL (ref 3.5–5.2)
Alkaline Phosphatase: 51 U/L (ref 39–117)
BILIRUBIN DIRECT: 0.1 mg/dL (ref 0.0–0.3)
Indirect Bilirubin: 0.4 mg/dL (ref 0.2–1.2)
TOTAL PROTEIN: 7.2 g/dL (ref 6.0–8.3)
Total Bilirubin: 0.5 mg/dL (ref 0.2–1.2)

## 2013-12-07 LAB — RPR

## 2013-12-07 LAB — HEMOGLOBIN A1C
Hgb A1c MFr Bld: 6.5 % — ABNORMAL HIGH (ref <5.7)
Mean Plasma Glucose: 140 mg/dL — ABNORMAL HIGH (ref <117)

## 2013-12-07 LAB — VITAMIN D 25 HYDROXY (VIT D DEFICIENCY, FRACTURES): VIT D 25 HYDROXY: 64 ng/mL (ref 30–89)

## 2013-12-07 LAB — HEPATITIS B CORE ANTIBODY, TOTAL: HEP B C TOTAL AB: NONREACTIVE

## 2013-12-07 LAB — VITAMIN B12: Vitamin B-12: 332 pg/mL (ref 211–911)

## 2013-12-07 LAB — PSA: PSA: 0.38 ng/mL (ref <=4.00)

## 2013-12-07 LAB — HIV ANTIBODY (ROUTINE TESTING W REFLEX): HIV 1&2 Ab, 4th Generation: NONREACTIVE

## 2013-12-07 LAB — MAGNESIUM: MAGNESIUM: 2 mg/dL (ref 1.5–2.5)

## 2013-12-07 LAB — TESTOSTERONE: Testosterone: 214 ng/dL — ABNORMAL LOW (ref 300–890)

## 2013-12-07 LAB — HEPATITIS B SURFACE ANTIBODY,QUALITATIVE: HEP B S AB: NEGATIVE

## 2013-12-08 LAB — HEPATITIS B E ANTIBODY: Hepatitis Be Antibody: NONREACTIVE

## 2013-12-09 LAB — TB SKIN TEST
Induration: 0 mm
TB SKIN TEST: NEGATIVE

## 2013-12-16 ENCOUNTER — Other Ambulatory Visit: Payer: Self-pay | Admitting: Internal Medicine

## 2014-01-28 ENCOUNTER — Other Ambulatory Visit: Payer: Self-pay | Admitting: Internal Medicine

## 2014-02-19 ENCOUNTER — Other Ambulatory Visit: Payer: Self-pay | Admitting: Physician Assistant

## 2014-02-23 ENCOUNTER — Other Ambulatory Visit: Payer: Self-pay | Admitting: Physician Assistant

## 2014-03-10 ENCOUNTER — Other Ambulatory Visit: Payer: Self-pay | Admitting: Internal Medicine

## 2014-03-12 ENCOUNTER — Other Ambulatory Visit: Payer: Self-pay | Admitting: Internal Medicine

## 2014-03-13 ENCOUNTER — Other Ambulatory Visit: Payer: Self-pay | Admitting: Internal Medicine

## 2014-03-13 ENCOUNTER — Encounter: Payer: Self-pay | Admitting: Internal Medicine

## 2014-03-14 ENCOUNTER — Ambulatory Visit: Payer: Self-pay | Admitting: Physician Assistant

## 2014-03-21 ENCOUNTER — Ambulatory Visit (INDEPENDENT_AMBULATORY_CARE_PROVIDER_SITE_OTHER): Payer: BC Managed Care – PPO | Admitting: Internal Medicine

## 2014-03-21 ENCOUNTER — Encounter: Payer: Self-pay | Admitting: Internal Medicine

## 2014-03-21 ENCOUNTER — Ambulatory Visit: Payer: Self-pay | Admitting: Internal Medicine

## 2014-03-21 VITALS — BP 108/80 | HR 60 | Temp 97.5°F | Resp 16 | Ht 68.5 in | Wt 230.8 lb

## 2014-03-21 DIAGNOSIS — I10 Essential (primary) hypertension: Secondary | ICD-10-CM | POA: Insufficient documentation

## 2014-03-21 DIAGNOSIS — Z79899 Other long term (current) drug therapy: Secondary | ICD-10-CM | POA: Insufficient documentation

## 2014-03-21 DIAGNOSIS — R110 Nausea: Secondary | ICD-10-CM

## 2014-03-21 MED ORDER — HYOSCYAMINE SULFATE 0.125 MG PO TABS: 0.1250 mg | ORAL_TABLET | ORAL | Status: DC | PRN

## 2014-03-21 NOTE — Patient Instructions (Signed)

## 2014-03-21 NOTE — Patient Instructions (Signed)

## 2014-03-21 NOTE — Progress Notes (Signed)
Patient ID: Lynnette CaffeyMark Duerst, male   DOB: 07/16/1965, 48 y.o.   MRN: [id number]  [name]    [facility]

## 2014-03-21 NOTE — Progress Notes (Signed)
   Subjective:    Patient ID: Lynnette CaffeyMark Walkowski, male    DOB: 04/15/1966, 48 y.o.   MRN: [id number]  HPI A very nice 48 yo MWM with 3-4 wk hx/o nausea usually awakening him at nite about 1 to 3 Am . Denies reflux or water brash/HB or other abdominal discomfort. PMH & Meds reviewed and ? Whether MF may be contributory.  Scheduled Meds: Continuous Infusions: PRN Meds:.   Allergies  Allergen Reactions  . Citalopram Diarrhea  . Sertraline    Past Medical History  Diagnosis Date  . Hyperlipidemia   . Hypertension   . Hypothyroidism   . Depression   . Hypogonadism male   . Pre-diabetes   . Vitamin D deficiency    No past surgical history on file.  Review of Systems In addition to the HPI above,  No Fever-chills,  No Headache, No changes with Vision or hearing,  No problems swallowing food or Liquids,  No Chest pain or productive Cough or Shortness of Breath,  No Abdominal pain, No Nausea or Vomitting, Bowel movements are regular,  No Blood in stool or Urine,  No dysuria,  No new skin rashes or bruises,  No new joints pains-aches,  No new weakness, tingling, numbness in any extremity,  No recent weight loss,  No polyuria, polydypsia or polyphagia,  No significant Mental Stressors.  A full 10 point Review of Systems was done, except as stated above, all other Review of Systems were negative  Objective:   Physical Exam BP 108/80  Pulse 60  Temp(Src) 97.5 F (36.4 C) (Temporal)  Resp 16  Ht 5' 8.5" (1.74 m)  Wt 230 lb 12.8 oz (104.69 kg)  BMI 34.58 kg/m2  HEENT - Eac's patent. TM's Nl. EOM's full. PERRLA. NasoOroPharynx clear. Neck - supple. Nl Thyroid. Carotids 2+ & No bruits, nodes, JVD Chest - Clear equal BS w/o Rales, rhonchi, wheezes. Cor - Nl HS. RRR w/o sig MGR. PP 1(+). No edema. Abd - No palpable organomegaly, masses or tenderness. BS nl. MS- FROM w/o deformities. Muscle power, tone and bulk Nl. Gait Nl. Neuro - No obvious Cr N abnormalities. Sensory, motor and  Cerebellar functions appear Nl w/o focal abnormalities. Psyche - Mental status normal & appropriate.  No delusions, ideations or obvious mood abnormalities.  Assessment & Plan:   1. Nausea alone  - CBC with Differential - BASIC METABOLIC PANEL WITH GFR - Hepatic function panel - Amylase - Helicobacter pylori abs-IgG+IgA, bld - US Abdomen Complete; Standing - US Abdomen Complete

## 2014-03-22 LAB — CBC WITH DIFFERENTIAL/PLATELET
BASOS ABS: 0.1 10*3/uL (ref 0.0–0.1)
BASOS PCT: 1 % (ref 0–1)
EOS ABS: 0.1 10*3/uL (ref 0.0–0.7)
EOS PCT: 2 % (ref 0–5)
HCT: 39.6 % (ref 39.0–52.0)
Hemoglobin: 13.5 g/dL (ref 13.0–17.0)
LYMPHS ABS: 1.2 10*3/uL (ref 0.7–4.0)
Lymphocytes Relative: 17 % (ref 12–46)
MCH: 28.7 pg (ref 26.0–34.0)
MCHC: 34.1 g/dL (ref 30.0–36.0)
MCV: 84.3 fL (ref 78.0–100.0)
Monocytes Absolute: 0.4 10*3/uL (ref 0.1–1.0)
Monocytes Relative: 6 % (ref 3–12)
NEUTROS PCT: 74 % (ref 43–77)
Neutro Abs: 5.3 10*3/uL (ref 1.7–7.7)
Platelets: 356 10*3/uL (ref 150–400)
RBC: 4.7 MIL/uL (ref 4.22–5.81)
RDW: 13.5 % (ref 11.5–15.5)
WBC: 7.1 10*3/uL (ref 4.0–10.5)

## 2014-03-22 LAB — HELICOBACTER PYLORI ABS-IGG+IGA, BLD
H PYLORI IGG: 0.44 {ISR}
HELICOBACTER PYLORI AB, IGA: 5.3 U/mL (ref <9.0)

## 2014-03-22 LAB — BASIC METABOLIC PANEL WITH GFR
BUN: 8 mg/dL (ref 6–23)
CALCIUM: 10.2 mg/dL (ref 8.4–10.5)
CO2: 25 meq/L (ref 19–32)
CREATININE: 1.11 mg/dL (ref 0.50–1.35)
Chloride: 101 mEq/L (ref 96–112)
GFR, Est Non African American: 79 mL/min
Glucose, Bld: 95 mg/dL (ref 70–99)
Potassium: 5 mEq/L (ref 3.5–5.3)
Sodium: 140 mEq/L (ref 135–145)

## 2014-03-22 LAB — HEPATIC FUNCTION PANEL
ALBUMIN: 4.6 g/dL (ref 3.5–5.2)
ALT: 18 U/L (ref 0–53)
AST: 19 U/L (ref 0–37)
Alkaline Phosphatase: 39 U/L (ref 39–117)
BILIRUBIN TOTAL: 0.5 mg/dL (ref 0.2–1.2)
Bilirubin, Direct: 0.1 mg/dL (ref 0.0–0.3)
Indirect Bilirubin: 0.4 mg/dL (ref 0.2–1.2)
Total Protein: 6.8 g/dL (ref 6.0–8.3)

## 2014-03-22 LAB — AMYLASE: AMYLASE: 34 U/L (ref 0–105)

## 2014-03-24 ENCOUNTER — Encounter: Payer: Self-pay | Admitting: Internal Medicine

## 2014-03-28 ENCOUNTER — Encounter: Payer: Self-pay | Admitting: Internal Medicine

## 2014-03-28 ENCOUNTER — Other Ambulatory Visit: Payer: Self-pay

## 2014-03-28 ENCOUNTER — Other Ambulatory Visit: Payer: Self-pay | Admitting: Internal Medicine

## 2014-03-28 ENCOUNTER — Ambulatory Visit
Admission: RE | Admit: 2014-03-28 | Discharge: 2014-03-28 | Disposition: A | Discharge status: Home / Self Care | Payer: BC Managed Care – PPO | Source: Ambulatory Visit | Attending: Internal Medicine | Admitting: Internal Medicine

## 2014-03-28 DIAGNOSIS — R1084 Generalized abdominal pain: Secondary | ICD-10-CM

## 2014-03-28 DIAGNOSIS — R52 Pain, unspecified: Secondary | ICD-10-CM

## 2014-03-29 ENCOUNTER — Encounter: Payer: Self-pay | Admitting: Internal Medicine

## 2014-04-12 ENCOUNTER — Encounter: Payer: Self-pay | Admitting: Internal Medicine

## 2014-04-13 ENCOUNTER — Ambulatory Visit: Payer: Self-pay | Admitting: Internal Medicine

## 2014-05-09 ENCOUNTER — Other Ambulatory Visit: Payer: Self-pay | Admitting: Physician Assistant

## 2014-05-09 MED ORDER — ALPRAZOLAM 1 MG PO TABS: ORAL_TABLET | ORAL | Status: DC

## 2014-05-19 ENCOUNTER — Encounter: Payer: Self-pay | Admitting: Internal Medicine

## 2014-05-29 ENCOUNTER — Other Ambulatory Visit: Payer: Self-pay

## 2014-05-29 MED ORDER — CITALOPRAM HYDROBROMIDE 20 MG PO TABS: ORAL_TABLET | ORAL | Status: DC

## 2014-06-01 ENCOUNTER — Ambulatory Visit: Payer: Self-pay | Admitting: Internal Medicine

## 2014-06-09 ENCOUNTER — Other Ambulatory Visit: Payer: Self-pay | Admitting: *Deleted

## 2014-06-09 MED ORDER — ALPRAZOLAM 1 MG PO TABS: ORAL_TABLET | ORAL | Status: DC

## 2014-06-14 ENCOUNTER — Ambulatory Visit: Payer: Self-pay | Admitting: Internal Medicine

## 2014-07-04 ENCOUNTER — Ambulatory Visit: Payer: Self-pay | Admitting: Physician Assistant

## 2014-07-06 ENCOUNTER — Ambulatory Visit: Payer: Self-pay | Admitting: Internal Medicine

## 2014-07-06 ENCOUNTER — Encounter: Payer: Self-pay | Admitting: Internal Medicine

## 2014-07-06 ENCOUNTER — Ambulatory Visit: Payer: Self-pay | Admitting: Physician Assistant

## 2014-07-06 ENCOUNTER — Ambulatory Visit (INDEPENDENT_AMBULATORY_CARE_PROVIDER_SITE_OTHER): Payer: BLUE CROSS/BLUE SHIELD | Admitting: Internal Medicine

## 2014-07-06 VITALS — BP 124/82 | HR 68 | Temp 97.9°F | Resp 16 | Ht 68.5 in | Wt 247.0 lb

## 2014-07-06 DIAGNOSIS — E291 Testicular hypofunction: Secondary | ICD-10-CM

## 2014-07-06 DIAGNOSIS — E119 Type 2 diabetes mellitus without complications: Secondary | ICD-10-CM

## 2014-07-06 DIAGNOSIS — Z79899 Other long term (current) drug therapy: Secondary | ICD-10-CM

## 2014-07-06 DIAGNOSIS — E782 Mixed hyperlipidemia: Secondary | ICD-10-CM

## 2014-07-06 DIAGNOSIS — E349 Endocrine disorder, unspecified: Secondary | ICD-10-CM

## 2014-07-06 DIAGNOSIS — E559 Vitamin D deficiency, unspecified: Secondary | ICD-10-CM

## 2014-07-06 DIAGNOSIS — I10 Essential (primary) hypertension: Secondary | ICD-10-CM

## 2014-07-06 LAB — CBC WITH DIFFERENTIAL/PLATELET
Basophils Absolute: 0.1 10*3/uL (ref 0.0–0.1)
Basophils Relative: 1 % (ref 0–1)
Eosinophils Absolute: 0.1 10*3/uL (ref 0.0–0.7)
Eosinophils Relative: 1 % (ref 0–5)
HEMATOCRIT: 40.8 % (ref 39.0–52.0)
Hemoglobin: 13.9 g/dL (ref 13.0–17.0)
LYMPHS ABS: 1.5 10*3/uL (ref 0.7–4.0)
Lymphocytes Relative: 24 % (ref 12–46)
MCH: 29.5 pg (ref 26.0–34.0)
MCHC: 34.1 g/dL (ref 30.0–36.0)
MCV: 86.6 fL (ref 78.0–100.0)
MONO ABS: 0.4 10*3/uL (ref 0.1–1.0)
MPV: 9.8 fL (ref 8.6–12.4)
Monocytes Relative: 6 % (ref 3–12)
NEUTROS ABS: 4.2 10*3/uL (ref 1.7–7.7)
Neutrophils Relative %: 68 % (ref 43–77)
PLATELETS: 282 10*3/uL (ref 150–400)
RBC: 4.71 MIL/uL (ref 4.22–5.81)
RDW: 14 % (ref 11.5–15.5)
WBC: 6.2 10*3/uL (ref 4.0–10.5)

## 2014-07-06 MED ORDER — TESTOSTERONE CYPIONATE 200 MG/ML IM SOLN: INTRAMUSCULAR | Status: DC

## 2014-07-06 MED ORDER — PHENTERMINE HCL 37.5 MG PO TABS: ORAL_TABLET | ORAL | Status: DC

## 2014-07-06 NOTE — Patient Instructions (Signed)

## 2014-07-06 NOTE — Progress Notes (Signed)
Patient ID: Juan Campos, male   DOB: [birth year], 49 y.o.   MRN: [id number]   This very nice 48 y.o.MWM presents for 3 month follow up with Hypertension, Hyperlipidemia, Morbid Obesity, T2_NIDDM and Vitamin D Deficiency.    Patient is treated for HTN & BP has been controlled at home. Today's BP: 124/82 mmHg. Patient has had no complaints of any cardiac type chest pain, palpitations, dyspnea/orthopnea/PND, dizziness, claudication, or dependent edema.   Hyperlipidemia is controlled with diet & meds. Patient denies myalgias or other med SE's. Last Lipids were at goal - Total Chol 170; HDL 36; LDL  85; with elevated Trig 246 on 12/06/2013.   Also, the patient has history of  Morbid Obesity (BMI 37.01) and consequent T2_NIDDM. Patient had lost weight and also had GI intolerance to Metformin which he self d/c'd. Unfortunately he has gained 16-18# off Phentermine over the last 3-4 months. He has not been monitoring CBG's and has had no symptoms of reactive hypoglycemia, diabetic polys, paresthesias or visual blurring.  Last A1c was  6.5% on  12/06/2013.   Further, the patient also has history of Vitamin D Deficiency and supplements vitamin D without any suspected side-effects. Last vitamin D was on   12/06/2013.    Medication List   atenolol 100 MG tablet  Commonly known as:  TENORMIN  TAKE 1 TABLET BY MOUTH EVERY DAY FOR BLOOD PRESSURE     atorvastatin 80 MG tablet  Commonly known as:  LIPITOR  TAKE 1 TABLET EVERY DAY     buPROPion 300 MG 24 hr tablet  Commonly known as:  WELLBUTRIN XL  TAKE 1 TABLET BY MOUTH DAILY     citalopram 20 MG tablet  Commonly known as:  CELEXA  TAKE 1/2 TO 1 TABLET BY MOUTH EVERY DAY     ergocalciferol 50000 UNITS capsule  Commonly known as:  VITAMIN D2  Take 1 capsule daily for severe Vitamin D Deficiency     fenofibrate micronized 134 MG capsule  Commonly known as:  LOFIBRA  TAKE ONE CAPSULE BY MOUTH EVERY DAY     hyoscyamine 0.125 MG tablet  Commonly known as:   LEVSIN  Take 1 tablet (0.125 mg total) by mouth every 4 (four) hours as needed for cramping (diarrhea,nausea).     levothyroxine 100 MCG tablet  Commonly known as:  SYNTHROID, LEVOTHROID  TAKE 1 TABLET BY MOUTH EVERY DAY OR AS DIRECTED     metFORMIN 500 MG 24 hr tablet -                                                Currently Off Tx     phentermine 37.5 MG tablet  Commonly known as:  ADIPEX-P  TAKE 1/2 TO 1 TABLET EVERY MORNING   For diet and weight loss     testosterone cypionate 200 MG/ML injection  Commonly known as:  DEPOTESTOTERONE CYPIONATE     Allergies  Allergen Reactions  . Citalopram Diarrhea  . Sertraline    PMHx:   Past Medical History  Diagnosis Date  . Hyperlipidemia   . Hypertension   . Hypothyroidism   . Depression   . Hypogonadism male   . Pre-diabetes   . Vitamin D deficiency    Immunization History  Administered Date(s) Administered  . PPD Test 12/06/2013  . Pneumococcal Polysaccharide-23 11/23/2008  . Tdap 12/02/2012  FHx:    Reviewed / unchanged  SHx:    Reviewed / unchanged  Systems Review:  Constitutional: Denies fever, chills, wt changes, headaches, insomnia, fatigue, night sweats, change in appetite. Eyes: Denies redness, blurred vision, diplopia, discharge, itchy, watery eyes.  ENT: Denies discharge, congestion, post nasal drip, epistaxis, sore throat, earache, hearing loss, dental pain, tinnitus, vertigo, sinus pain, snoring.  CV: Denies chest pain, palpitations, irregular heartbeat, syncope, dyspnea, diaphoresis, orthopnea, PND, claudication or edema. Respiratory: denies cough, dyspnea, DOE, pleurisy, hoarseness, laryngitis, wheezing.  Gastrointestinal: Denies dysphagia, odynophagia, heartburn, reflux, water brash, abdominal pain or cramps, nausea, vomiting, bloating, diarrhea, constipation, hematemesis, melena, hematochezia  or hemorrhoids. Genitourinary: Denies dysuria, frequency, urgency, nocturia, hesitancy, discharge, hematuria or  flank pain. Musculoskeletal: Denies arthralgias, myalgias, stiffness, jt. swelling, pain, limping or strain/sprain.  Skin: Denies pruritus, rash, hives, warts, acne, eczema or change in skin lesion(s). Neuro: No weakness, tremor, incoordination, spasms, paresthesia or pain. Psychiatric: Denies confusion, memory loss or sensory loss. Endo: Denies change in weight, skin or hair change.  Heme/Lymph: No excessive bleeding, bruising or enlarged lymph nodes.  Physical Exam  BP 124/82  Pulse 68  Temp  97.9 F   Resp 16  Ht 5' 8.5"   Wt 247 lb    BMI 37.01   Appears well nourished and in no distress. Eyes: PERRLA, EOMs, conjunctiva no swelling or erythema. Sinuses: No frontal/maxillary tenderness ENT/Mouth: EAC's clear, TM's nl w/o erythema, bulging. Nares clear w/o erythema, swelling, exudates. Oropharynx clear without erythema or exudates. Oral hygiene is good. Tongue normal, non obstructing. Hearing intact.  Neck: Supple. Thyroid nl. Car 2+/2+ without bruits, nodes or JVD. Chest: Respirations nl with BS clear & equal w/o rales, rhonchi, wheezing or stridor.  Cor: Heart sounds normal w/ regular rate and rhythm without sig. murmurs, gallops, clicks, or rubs. Peripheral pulses normal and equal  without edema.  Abdomen: Soft & bowel sounds normal. Non-tender w/o guarding, rebound, hernias, masses, or organomegaly.  Lymphatics: Unremarkable.  Musculoskeletal: Full ROM all peripheral extremities, joint stability, 5/5 strength, and normal gait.  Skin: Warm, dry without exposed rashes, lesions or ecchymosis apparent.  Neuro: Cranial nerves intact, reflexes equal bilaterally. Sensory-motor testing grossly intact. Tendon reflexes grossly intact.  Pysch: Alert & oriented x 3.  Insight and judgement nl & appropriate. No ideations.  Assessment and Plan:  1. Essential hypertension  - TSH  2. Hyperlipidemia  - Lipid panel  3. Diabetes mellitus type II & Morbid Obesity  - Hemoglobin A1c -  Insulin, fasting - phentermine (ADIPEX-P) 37.5 MG tablet; TAKE 1/2 TO 1 TABLET EVERY MORNING   For diet and weight loss  Dispense: 30 tablet; Refill: 3  4. Vitamin D deficiency  - Vit D  25 hydroxy (rtn osteoporosis monitoring)  5. Medication management  - CBC with Differential - BASIC METABOLIC PANEL WITH GFR - Hepatic function panel - Magnesium  6. Testosterone deficiency  - Testosterone - Rx Depo - T   Recommended regular exercise, BP monitoring, weight control, and discussed med and SE's. Recommended labs to assess and monitor clinical status. Further disposition pending results of labs.  ROV 3-4 months.

## 2014-07-07 ENCOUNTER — Other Ambulatory Visit: Payer: Self-pay | Admitting: Internal Medicine

## 2014-07-07 ENCOUNTER — Encounter: Payer: Self-pay | Admitting: Internal Medicine

## 2014-07-07 LAB — LIPID PANEL
CHOLESTEROL: 139 mg/dL (ref 0–200)
HDL: 34 mg/dL — AB (ref >39)
LDL Cholesterol: 84 mg/dL (ref 0–99)
TRIGLYCERIDES: 105 mg/dL (ref <150)
Total CHOL/HDL Ratio: 4.1 Ratio
VLDL: 21 mg/dL (ref 0–40)

## 2014-07-07 LAB — HEMOGLOBIN A1C
HEMOGLOBIN A1C: 6.6 % — AB (ref <5.7)
MEAN PLASMA GLUCOSE: 143 mg/dL — AB (ref <117)

## 2014-07-07 LAB — BASIC METABOLIC PANEL WITH GFR
BUN: 12 mg/dL (ref 6–23)
CO2: 23 mEq/L (ref 19–32)
CREATININE: 1.15 mg/dL (ref 0.50–1.35)
Calcium: 9.3 mg/dL (ref 8.4–10.5)
Chloride: 103 mEq/L (ref 96–112)
GFR, Est African American: 87 mL/min
GFR, Est Non African American: 75 mL/min
Glucose, Bld: 112 mg/dL — ABNORMAL HIGH (ref 70–99)
POTASSIUM: 4.3 meq/L (ref 3.5–5.3)
Sodium: 138 mEq/L (ref 135–145)

## 2014-07-07 LAB — VITAMIN D 25 HYDROXY (VIT D DEFICIENCY, FRACTURES): Vit D, 25-Hydroxy: 43 ng/mL (ref 30–100)

## 2014-07-07 LAB — HEPATIC FUNCTION PANEL
ALT: 22 U/L (ref 0–53)
AST: 20 U/L (ref 0–37)
Albumin: 4.4 g/dL (ref 3.5–5.2)
Alkaline Phosphatase: 45 U/L (ref 39–117)
BILIRUBIN TOTAL: 0.5 mg/dL (ref 0.2–1.2)
Bilirubin, Direct: 0.1 mg/dL (ref 0.0–0.3)
Indirect Bilirubin: 0.4 mg/dL (ref 0.2–1.2)
Total Protein: 7 g/dL (ref 6.0–8.3)

## 2014-07-07 LAB — TSH: TSH: 2.38 u[IU]/mL (ref 0.350–4.500)

## 2014-07-07 LAB — TESTOSTERONE: TESTOSTERONE: 262 ng/dL — AB (ref 300–890)

## 2014-07-07 LAB — MAGNESIUM: Magnesium: 1.8 mg/dL (ref 1.5–2.5)

## 2014-07-07 LAB — INSULIN, FASTING: Insulin fasting, serum: 25.3 u[IU]/mL — ABNORMAL HIGH (ref 2.0–19.6)

## 2014-07-08 ENCOUNTER — Other Ambulatory Visit: Payer: Self-pay | Admitting: Internal Medicine

## 2014-07-08 MED ORDER — ALPRAZOLAM 1 MG PO TABS: ORAL_TABLET | ORAL | Status: DC

## 2014-07-09 MED ORDER — ALPRAZOLAM 1 MG PO TABS: ORAL_TABLET | ORAL | Status: DC

## 2014-07-10 ENCOUNTER — Other Ambulatory Visit: Payer: Self-pay | Admitting: Internal Medicine

## 2014-08-05 ENCOUNTER — Other Ambulatory Visit: Payer: Self-pay | Admitting: Internal Medicine

## 2014-08-08 ENCOUNTER — Telehealth (INDEPENDENT_AMBULATORY_CARE_PROVIDER_SITE_OTHER): Payer: BLUE CROSS/BLUE SHIELD | Admitting: Physician Assistant

## 2014-08-08 DIAGNOSIS — J329 Chronic sinusitis, unspecified: Secondary | ICD-10-CM

## 2014-08-08 MED ORDER — PREDNISONE 20 MG PO TABS: ORAL_TABLET | ORAL | Status: DC

## 2014-08-08 MED ORDER — AZITHROMYCIN 250 MG PO TABS: ORAL_TABLET | ORAL | Status: DC

## 2014-08-08 NOTE — Telephone Encounter (Signed)
EVISIT TELEPHONE Patient called the office for a telephone visit complaining of possible sinusitis. Symptoms include no  fever, post nasal drip, productive cough with  white colored sputum and headache. Onset of symptoms was 6 days ago, and has been gradually worsening since that time. Treatment to date: tussin DM.  PLAN: URI- Discussed the diagnosis and treatment of sinusitis. Discussed the importance of avoiding unnecessary antibiotic therapy. Suggested symptomatic OTC remedies. Nasal saline spray for congestion. Zithromax per orders. Follow up as needed. Follow up in 7 days or as needed.

## 2014-08-29 ENCOUNTER — Other Ambulatory Visit: Payer: Self-pay | Admitting: *Deleted

## 2014-08-29 MED ORDER — BENZONATATE 200 MG PO CAPS: 200.0000 mg | ORAL_CAPSULE | Freq: Three times a day (TID) | ORAL | Status: DC | PRN

## 2014-08-29 NOTE — Telephone Encounter (Signed)
Patient called with c/o increasing cough times 3-4 weeks.  Symptoms worse at night, completed abx AD.  Patient requesting cough Rx.  No relief with OTC cough meds.  Per Dr. Oneta RackMcKeown Rx for tessalon perles sent  Into pharmacy and advised patient to f/u for ov if no relief.

## 2014-09-07 ENCOUNTER — Ambulatory Visit (INDEPENDENT_AMBULATORY_CARE_PROVIDER_SITE_OTHER): Payer: BLUE CROSS/BLUE SHIELD | Admitting: Internal Medicine

## 2014-09-07 VITALS — BP 132/80 | HR 72 | Temp 97.7°F | Resp 16 | Ht 68.0 in | Wt 253.4 lb

## 2014-09-07 DIAGNOSIS — F32A Depression, unspecified: Secondary | ICD-10-CM

## 2014-09-07 DIAGNOSIS — I10 Essential (primary) hypertension: Secondary | ICD-10-CM

## 2014-09-07 DIAGNOSIS — E291 Testicular hypofunction: Secondary | ICD-10-CM

## 2014-09-07 DIAGNOSIS — E559 Vitamin D deficiency, unspecified: Secondary | ICD-10-CM

## 2014-09-07 DIAGNOSIS — F329 Major depressive disorder, single episode, unspecified: Secondary | ICD-10-CM

## 2014-09-07 DIAGNOSIS — Z79899 Other long term (current) drug therapy: Secondary | ICD-10-CM

## 2014-09-07 MED ORDER — ALPRAZOLAM 1 MG PO TABS: ORAL_TABLET | ORAL | Status: AC

## 2014-09-07 MED ORDER — PHENTERMINE HCL 37.5 MG PO TABS: ORAL_TABLET | ORAL | Status: DC

## 2014-09-07 MED ORDER — CITALOPRAM HYDROBROMIDE 40 MG PO TABS: ORAL_TABLET | ORAL | Status: DC

## 2014-09-07 MED ORDER — ERGOCALCIFEROL 1.25 MG (50000 UT) PO CAPS: ORAL_CAPSULE | ORAL | Status: DC

## 2014-09-07 MED ORDER — TESTOSTERONE CYPIONATE 200 MG/ML IM SOLN
INTRAMUSCULAR | Status: DC
Start: 2014-09-07 — End: 2014-12-07

## 2014-09-07 NOTE — Patient Instructions (Signed)
  Dr Beverly SessionsEd Luray  [phone]  [phone]

## 2014-09-09 ENCOUNTER — Encounter: Payer: Self-pay | Admitting: Internal Medicine

## 2014-09-09 NOTE — Progress Notes (Signed)
Subjective:    Patient ID: Juan Campos, male    DOB: 1965-10-27, 49 y.o.   MRN: [id number]  HPI  Patient presents in f/u to discuss his depression related to job dissatisfaction  And also is having some interpersonal difficulties with his spouse and feels that his marriage has evolved to a "roommate" situation over the last 3 years of a 9 yr marriage. He relates that his wife is not forthcoming in discussing the lack of intimacy in the relationship. He is somewhat depressed and distressed and denies tearful episodes, but is not suicidal.  Medication Sig  . atenolol (TENORMIN) 100 MG tablet TAKE 1 TAB EVERY DAY   . atorvastatin (LIPITOR) 80 MG tablet TAKE 1 TAB EVERY DAY  . buPROPion (WELLBUTRIN XL) 300 MG 24 hr tablet TAKE 1 TAB DAILY  . fenofibrate micronized (LOFIBRA) 134 MG capsule TAKE ONE CAP EVERY DAY  . levothyroxine  100 MCG tablet TAKE 1 TAB EVERY DAY OR AS DIRECTED  . metFORMIN (GLUCOPHAGE-XR) 500 MG 24 hr tablet Take  4  times daily (1-1-2 pc)  . ALPRAZolam (XANAX) 1 MG tablet TAKE 1/2 TO 1 TAB 2-3 TIMES DAILY AS NEEDED   . citalopram (CELEXA) 20 MG tablet TAKE 1/2-1 TAB DAILY  . ergocalciferol (VITAMIN D2) 50000 UNITS capsule Take on capsule daily  . phentermine (ADIPEX-P) 37.5 MG tablet TAKE 1/2 TO 1 TAB EVERY MORNING   For diet and weight loss  . DEPO-TESTOTERONE  200 MG/ML inj INJECT 2 MLS IM EVERY 2 WEEKS & need 3 cc syringes & 1" 21 G needles  . hyoscyamine (LEVSIN) 0.125 MG tablet Take 1 tab every 4hours as needed   . predniSONE (DELTASONE) 20 MG tablet 2 tablets daily for 3 days, 1 tablet daily for 4 days.   Allergies  Allergen Reactions  . Citalopram Diarrhea  . Sertraline    Past Medical History  Diagnosis Date  . Hyperlipidemia   . Hypertension   . Hypothyroidism   . Depression   . Hypogonadism male   . Pre-diabetes   . Vitamin D deficiency    Review of Systems   10 point systems review negative except as above.     Objective:   Physical Exam BP 132/80  mmHg  Pulse 72  Temp(Src) 97.7 F (36.5 C)  Resp 16  Ht  (1.727 m)  Wt 253 lb 6.4 oz (114.941 kg)  BMI 38.54 kg/m2  HEENT - Eac's patent. TM's Nl. EOM's full. PERRLA. NasoOroPharynx clear. Neck - supple. Nl Thyroid. Carotids 2+ & No bruits, nodes, JVD Chest - Clear equal BS w/o Rales, rhonchi, wheezes. Cor - Nl HS. RRR w/o sig MGR. PP 1(+). No edema. Abd - No palpable organomegaly, masses or tenderness. BS nl. MS- FROM w/o deformities. Muscle power, tone and bulk Nl. Gait Nl. Neuro - No obvious Cr N abnormalities. Sensory, motor and Cerebellar functions appear Nl w/o focal abnormalities. Psyche - Mental status flat / depressed mood - w/o suicidal ideations    Assessment & Plan:   1. Essential hypertension   2. Morbid obesity  - phentermine (ADIPEX-P) 37.5 MG tablet; TAKE 1/2 TO 1 TABLET EVERY MORNING   For diet and weight loss  Dispense: 30 tablet; Refill: 5  3. Vitamin D deficiency  - ergocalciferol (VITAMIN D2) 50000 UNITS capsule; Take 1 capsule daily or as directed for severe Vitamin D Deficiency  Dispense: 30 capsule; Refill: 5  3. Depression  - ALPRAZolam (XANAX) 1 MG tablet; Take 1/2 to  1 tablet 3 x day as needed for anxiety  Dispense: 90 tablet; Refill: 5 - citalopram (CELEXA) 40 MG tablet; Take 1 tablet daily for Mood  Dispense: 90 tablet; Refill: 1  - Patient is agreeable to see psychologist & was referred to Dr Honor LohLuray  - Discussed meds/SE's  - ROV 1 month for reassessment

## 2014-09-12 ENCOUNTER — Other Ambulatory Visit: Payer: Self-pay | Admitting: *Deleted

## 2014-09-12 ENCOUNTER — Encounter: Payer: Self-pay | Admitting: Internal Medicine

## 2014-09-12 MED ORDER — GLUCOSE BLOOD VI STRP: ORAL_STRIP | Status: AC

## 2014-10-05 ENCOUNTER — Ambulatory Visit (INDEPENDENT_AMBULATORY_CARE_PROVIDER_SITE_OTHER): Payer: Self-pay | Admitting: Internal Medicine

## 2014-10-05 DIAGNOSIS — R69 Illness, unspecified: Secondary | ICD-10-CM

## 2014-10-05 NOTE — Progress Notes (Signed)
Patient ID: Juan CaffeyMark Averhart, male   DOB: 12/13/1965, 49 y.o.   MRN: [id number]  [name]  O      S  H  O  W

## 2014-10-08 ENCOUNTER — Other Ambulatory Visit: Payer: Self-pay | Admitting: Physician Assistant

## 2014-10-08 ENCOUNTER — Other Ambulatory Visit: Payer: Self-pay | Admitting: Internal Medicine

## 2014-10-08 DIAGNOSIS — F32A Depression, unspecified: Secondary | ICD-10-CM

## 2014-10-08 DIAGNOSIS — F329 Major depressive disorder, single episode, unspecified: Secondary | ICD-10-CM

## 2014-10-26 ENCOUNTER — Encounter: Payer: Self-pay | Admitting: Internal Medicine

## 2014-10-27 ENCOUNTER — Encounter: Payer: Self-pay | Admitting: Internal Medicine

## 2014-12-07 ENCOUNTER — Ambulatory Visit (INDEPENDENT_AMBULATORY_CARE_PROVIDER_SITE_OTHER): Payer: BLUE CROSS/BLUE SHIELD | Admitting: Internal Medicine

## 2014-12-07 ENCOUNTER — Encounter: Payer: Self-pay | Admitting: Internal Medicine

## 2014-12-07 ENCOUNTER — Other Ambulatory Visit: Payer: Self-pay | Admitting: *Deleted

## 2014-12-07 VITALS — BP 116/82 | HR 76 | Temp 97.5°F | Resp 16 | Ht 68.25 in | Wt 255.0 lb

## 2014-12-07 DIAGNOSIS — E039 Hypothyroidism, unspecified: Secondary | ICD-10-CM | POA: Diagnosis not present

## 2014-12-07 DIAGNOSIS — Z1212 Encounter for screening for malignant neoplasm of rectum: Secondary | ICD-10-CM

## 2014-12-07 DIAGNOSIS — E291 Testicular hypofunction: Secondary | ICD-10-CM

## 2014-12-07 DIAGNOSIS — Z79899 Other long term (current) drug therapy: Secondary | ICD-10-CM

## 2014-12-07 DIAGNOSIS — R5383 Other fatigue: Secondary | ICD-10-CM

## 2014-12-07 DIAGNOSIS — F329 Major depressive disorder, single episode, unspecified: Secondary | ICD-10-CM

## 2014-12-07 DIAGNOSIS — E119 Type 2 diabetes mellitus without complications: Secondary | ICD-10-CM | POA: Diagnosis not present

## 2014-12-07 DIAGNOSIS — E559 Vitamin D deficiency, unspecified: Secondary | ICD-10-CM

## 2014-12-07 DIAGNOSIS — I10 Essential (primary) hypertension: Secondary | ICD-10-CM | POA: Diagnosis not present

## 2014-12-07 DIAGNOSIS — F32A Depression, unspecified: Secondary | ICD-10-CM

## 2014-12-07 DIAGNOSIS — Z111 Encounter for screening for respiratory tuberculosis: Secondary | ICD-10-CM | POA: Diagnosis not present

## 2014-12-07 DIAGNOSIS — E782 Mixed hyperlipidemia: Secondary | ICD-10-CM | POA: Diagnosis not present

## 2014-12-07 DIAGNOSIS — Z125 Encounter for screening for malignant neoplasm of prostate: Secondary | ICD-10-CM | POA: Diagnosis not present

## 2014-12-07 LAB — CBC WITH DIFFERENTIAL/PLATELET
BASOS PCT: 1 % (ref 0–1)
Basophils Absolute: 0.1 10*3/uL (ref 0.0–0.1)
EOS ABS: 0.1 10*3/uL (ref 0.0–0.7)
Eosinophils Relative: 1 % (ref 0–5)
HEMATOCRIT: 40.7 % (ref 39.0–52.0)
HEMOGLOBIN: 13.9 g/dL (ref 13.0–17.0)
Lymphocytes Relative: 23 % (ref 12–46)
Lymphs Abs: 1.7 10*3/uL (ref 0.7–4.0)
MCH: 29.4 pg (ref 26.0–34.0)
MCHC: 34.2 g/dL (ref 30.0–36.0)
MCV: 86.2 fL (ref 78.0–100.0)
MONO ABS: 0.5 10*3/uL (ref 0.1–1.0)
MPV: 9.7 fL (ref 8.6–12.4)
Monocytes Relative: 7 % (ref 3–12)
Neutro Abs: 5.1 10*3/uL (ref 1.7–7.7)
Neutrophils Relative %: 68 % (ref 43–77)
PLATELETS: 327 10*3/uL (ref 150–400)
RBC: 4.72 MIL/uL (ref 4.22–5.81)
RDW: 13.7 % (ref 11.5–15.5)
WBC: 7.5 10*3/uL (ref 4.0–10.5)

## 2014-12-07 MED ORDER — ATORVASTATIN CALCIUM 80 MG PO TABS: 80.0000 mg | ORAL_TABLET | Freq: Every day | ORAL | Status: AC

## 2014-12-07 MED ORDER — FENOFIBRATE MICRONIZED 134 MG PO CAPS: 134.0000 mg | ORAL_CAPSULE | Freq: Every day | ORAL | Status: DC

## 2014-12-07 MED ORDER — BUPROPION HCL ER (XL) 300 MG PO TB24: 300.0000 mg | ORAL_TABLET | Freq: Every day | ORAL | Status: DC

## 2014-12-07 NOTE — Patient Instructions (Signed)

## 2014-12-07 NOTE — Progress Notes (Signed)
Patient ID: Juan Campos, male   DOB: 01-04-1966, 49 y.o.   MRN: [id number]   Annual Comprehensive Examination  This very nice 49 y.o. MWM  presents for complete physical.  Patient has been followed for HTN, Prediabetes, Hyperlipidemia, Testosterone and Vitamin D Deficiency. Patient has recently been started on Citalopram for stress related to job and marriage and reports that he feels much better and is coping better with his wife.    HTN predates since 2003. Patient's BP has been controlled at home.Today's BP: 116/82 mmHg. Patient denies any cardiac symptoms as chest pain, palpitations, shortness of breath, dizziness or ankle swelling.   Patient's hyperlipidemia is controlled with diet and medications. Patient denies myalgias or other medication SE's. Last lipids were at goal - Chol 139; HDL 34; LDL  84; Trig 105 on 07/06/2014.    Patient has Morbid Obesity  (BMI 38.47) and consequent T2_NIDDM and insulin resistance since 2009 and patient denies reactive hypoglycemic symptoms, visual blurring, diabetic polys or paresthesias. Last A1c was 6.6% on 07/06/2014. He had been on #4 Metformin XR daily , but stopped dur to nausea.    Patient has 'Low T' with a low level of 219 & has been on Testosterone replacement since 2009. Finally, patient has history of Vitamin D Deficiency of 18 in 2008 and last vitamin D was 43 on 07/06/2014.      Medication Sig  . ALPRAZolam  1 MG tablet Take 1/2 to 1 tablet 3 x day as needed for anxiety  . atenolol  100 MG tablet TAKE 1 TABLET BY MOUTH EVERY DAY FOR BLOOD PRESSURE  . citalopram  40 MG tablet Take 1 tablet daily for Mood  . VITAMIN D2 50000 UNITS cap Take 1 capsule daily or as directed for severe Vitamin D Deficiency  . levothyroxine  100 MCG tablet TAKE 1 TABLET BY MOUTH EVERY DAY OR AS DIRECTED  . atorvastatin  80 MG tablet TAKE 1 TABLET EVERY DAY  . buPROPion  XL 300 MG 24 hr tablet TAKE 1 TABLET BY MOUTH DAILY  . fenofibrate  134 MG capsule TAKE ONE CAPSULE BY  MOUTH EVERY DAY  . metFORMIN -XR) 500 MG 24 hr tablet Take 500 mg by mouth 4 (four) times daily - currently off  . phentermine  37.5 MG tablet TAKE 1/2 TO 1 TABLET EVERY MORNING   For diet and weight loss  .  (DEPO-TESTOTERONE  200 MG/ML  INJECT 2 MLS INTRAMUSCULARLY EVERY 2 WEEKS & need 3 cc syringes & 1" 21 G needles   Allergies  Allergen Reactions  . Citalopram Diarrhea  . Sertraline    Past Medical History  Diagnosis Date  . Hyperlipidemia   . Hypertension   . Hypothyroidism   . Depression   . Hypogonadism male   . Pre-diabetes   . Vitamin D deficiency    Health Maintenance  Topic Date Due  . PNEUMOCOCCAL POLYSACCHARIDE VACCINE (2) 11/23/2013  . FOOT EXAM  12/07/2014  . URINE MICROALBUMIN  12/07/2014  . HEMOGLOBIN A1C  01/04/2015  . INFLUENZA VACCINE  01/22/2015  . OPHTHALMOLOGY EXAM  03/30/2015  . TETANUS/TDAP  12/03/2022  . HIV Screening  Completed   Immunization History  Administered Date(s) Administered  . PPD Test 12/06/2013  . Pneumococcal Polysaccharide-23 11/23/2008  . Tdap 12/02/2012   No past surgical history on file. Family History  Problem Relation Age of Onset  . Hyperlipidemia Mother   . Hypertension Mother   . Hypertension Sister   . Colon cancer  Maternal Grandmother    History   Social History  . Marital Status: Single    Spouse Name: N/A  . Number of Children: N/A  . Years of Education: N/A   Occupational History  . Works in a print shop x 24 years    Social History Main Topics  . Smoking status: Never Smoker   . Smokeless tobacco: Never Used  . Alcohol Use: No  . Drug Use: No  . Sexual Activity: Deprived    ROS Constitutional: Denies fever, chills, weight loss/gain, headaches, insomnia,  night sweats or change in appetite. Does c/o fatigue. Eyes: Denies redness, blurred vision, diplopia, discharge, itchy or watery eyes.  ENT: Denies discharge, congestion, post nasal drip, epistaxis, sore throat, earache, hearing loss, dental  pain, Tinnitus, Vertigo, Sinus pain or snoring.  Cardio: Denies chest pain, palpitations, irregular heartbeat, syncope, dyspnea, diaphoresis, orthopnea, PND, claudication or edema Respiratory: denies cough, dyspnea, DOE, pleurisy, hoarseness, laryngitis or wheezing.  Gastrointestinal: Denies dysphagia, heartburn, reflux, water brash, pain, cramps, nausea, vomiting, bloating, diarrhea, constipation, hematemesis, melena, hematochezia, jaundice or hemorrhoids Genitourinary: Denies dysuria, frequency, urgency, nocturia, hesitancy, discharge, hematuria or flank pain Musculoskeletal: Denies arthralgia, myalgia, stiffness, Jt. Swelling, pain, limp or strain/sprain. Denies Falls. Skin: Denies puritis, rash, hives, warts, acne, eczema or change in skin lesion Neuro: No weakness, tremor, incoordination, spasms, paresthesia or pain Psychiatric: Denies confusion, memory loss or sensory loss. Denies Depression. Endocrine: Denies change in weight, skin, hair change, nocturia, and paresthesia, diabetic polys, visual blurring or hyper / hypo glycemic episodes.  Heme/Lymph: No excessive bleeding, bruising or enlarged lymph nodes.  Physical Exam  BP 116/82   Pulse 76  Temp 97.5 F   Resp 16  Ht 5' 8.25"   Wt 255 lb     BMI 38.47   General Appearance: Well nourished, in no apparent distress. Eyes: PERRLA, EOMs, conjunctiva no swelling or erythema, normal fundi and vessels. Sinuses: No frontal/maxillary tenderness ENT/Mouth: EACs patent / TMs  nl. Nares clear without erythema, swelling, mucoid exudates. Oral hygiene is good. No erythema, swelling, or exudate. Tongue normal, non-obstructing. Tonsils not swollen or erythematous. Hearing normal.  Neck: Supple, thyroid normal. No bruits, nodes or JVD. Respiratory: Respiratory effort normal.  BS equal and clear bilateral without rales, rhonci, wheezing or stridor. Cardio: Heart sounds are normal with regular rate and rhythm and no murmurs, rubs or gallops.  Peripheral pulses are normal and equal bilaterally without edema. No aortic or femoral bruits. Chest: symmetric with normal excursions and percussion.  Abdomen: Flat, soft, with bowel sounds. Nontender, no guarding, rebound, hernias, masses, or organomegaly.  Lymphatics: Non tender without lymphadenopathy.  Genitourinary: No hernias.Testes nl. DRE - prostate nl for age - smooth & firm w/o nodules. Musculoskeletal: Full ROM all peripheral extremities, joint stability, 5/5 strength, and normal gait. Skin: Warm and dry without rashes, lesions, cyanosis, clubbing or  ecchymosis.  Neuro: Cranial nerves intact, reflexes equal bilaterally. Normal muscle tone, no cerebellar symptoms. Sensation intact.  Pysch: Awake and oriented X 3 with normal affect, insight and judgment appropriate.   Assessment and Plan  1. Essential hypertension  - EKG 12-Lead  2. Hyperlipidemia  - Lipid panel  3. T2_NIDDM  - Microalbumin / creatinine urine ratio - Hemoglobin A1c - Insulin, random  4. Vitamin D deficiency  - Vit D  25 hydroxy   5. Hypothyroidism   6. Morbid Obesity (BMI 35.8)   7. Testosterone Deficiency  - Testosterone  8. Prostate cancer screening  - PSA  9. Screening  for rectal cancer  - POC Hemoccult Bld/Stl   10. Other fatigue  - Vitamin B12 - Iron and TIBC - TSH  11. Medication management  - Urine Microscopic - CBC with Differential/Platelet - BASIC METABOLIC PANEL WITH GFR - Hepatic function panel - Magnesium   Continue prudent diet as discussed, weight control, BP monitoring, regular exercise, and medications as discussed.  Discussed med effects and SE's. Routine screening labs and tests as requested with regular follow-up as recommended.  Discussed with pt importance of weight loss and he did agree to re-try Metformin with a very slow and gradual titration from 1 up to 4 tablets daily.  Over 40 minutes of exam, counseling &  chart review was performed

## 2014-12-08 DIAGNOSIS — Z111 Encounter for screening for respiratory tuberculosis: Secondary | ICD-10-CM | POA: Diagnosis not present

## 2014-12-08 LAB — HEPATIC FUNCTION PANEL
ALBUMIN: 4.4 g/dL (ref 3.5–5.2)
ALT: 31 U/L (ref 0–53)
AST: 27 U/L (ref 0–37)
Alkaline Phosphatase: 61 U/L (ref 39–117)
BILIRUBIN DIRECT: 0.1 mg/dL (ref 0.0–0.3)
BILIRUBIN TOTAL: 0.4 mg/dL (ref 0.2–1.2)
Indirect Bilirubin: 0.3 mg/dL (ref 0.2–1.2)
TOTAL PROTEIN: 6.8 g/dL (ref 6.0–8.3)

## 2014-12-08 LAB — TSH: TSH: 1.927 u[IU]/mL (ref 0.350–4.500)

## 2014-12-08 LAB — URINALYSIS, MICROSCOPIC ONLY
BACTERIA UA: NONE SEEN
CRYSTALS: NONE SEEN
Casts: NONE SEEN
SQUAMOUS EPITHELIAL / LPF: NONE SEEN

## 2014-12-08 LAB — BASIC METABOLIC PANEL WITH GFR
BUN: 12 mg/dL (ref 6–23)
CHLORIDE: 101 meq/L (ref 96–112)
CO2: 26 mEq/L (ref 19–32)
Calcium: 9 mg/dL (ref 8.4–10.5)
Creat: 1.27 mg/dL (ref 0.50–1.35)
GFR, EST NON AFRICAN AMERICAN: 67 mL/min
GFR, Est African American: 77 mL/min
Glucose, Bld: 112 mg/dL — ABNORMAL HIGH (ref 70–99)
Potassium: 4.2 mEq/L (ref 3.5–5.3)
Sodium: 137 mEq/L (ref 135–145)

## 2014-12-08 LAB — IRON AND TIBC
%SAT: 22 % (ref 20–55)
IRON: 84 ug/dL (ref 42–165)
TIBC: 379 ug/dL (ref 215–435)
UIBC: 295 ug/dL (ref 125–400)

## 2014-12-08 LAB — PSA: PSA: 0.37 ng/mL (ref <=4.00)

## 2014-12-08 LAB — HEMOGLOBIN A1C
HEMOGLOBIN A1C: 7.1 % — AB (ref <5.7)
Mean Plasma Glucose: 157 mg/dL — ABNORMAL HIGH (ref <117)

## 2014-12-08 LAB — MICROALBUMIN / CREATININE URINE RATIO
CREATININE, URINE: 216.1 mg/dL
MICROALB/CREAT RATIO: 0.9 mg/g (ref 0.0–30.0)
Microalb, Ur: 0.2 mg/dL (ref <2.0)

## 2014-12-08 LAB — LIPID PANEL
Cholesterol: 201 mg/dL — ABNORMAL HIGH (ref 0–200)
HDL: 26 mg/dL — AB (ref >=40)
Total CHOL/HDL Ratio: 7.7 Ratio
Triglycerides: 412 mg/dL — ABNORMAL HIGH (ref <150)

## 2014-12-08 LAB — VITAMIN B12: VITAMIN B 12: 255 pg/mL (ref 211–911)

## 2014-12-08 LAB — TESTOSTERONE: TESTOSTERONE: 195 ng/dL — AB (ref 300–890)

## 2014-12-08 LAB — MAGNESIUM: Magnesium: 1.8 mg/dL (ref 1.5–2.5)

## 2014-12-08 LAB — VITAMIN D 25 HYDROXY (VIT D DEFICIENCY, FRACTURES): Vit D, 25-Hydroxy: 57 ng/mL (ref 30–100)

## 2014-12-08 LAB — INSULIN, RANDOM: INSULIN: 97.7 u[IU]/mL — AB (ref 2.0–19.6)

## 2014-12-08 NOTE — Addendum Note (Signed)
Addended by: Reggy Eye on: 12/08/2014 08:23 AM   Modules accepted: Orders

## 2014-12-12 ENCOUNTER — Other Ambulatory Visit: Payer: Self-pay | Admitting: Internal Medicine

## 2015-03-19 ENCOUNTER — Other Ambulatory Visit: Payer: Self-pay | Admitting: Internal Medicine

## 2015-03-22 ENCOUNTER — Other Ambulatory Visit: Payer: Self-pay | Admitting: Internal Medicine

## 2015-04-10 ENCOUNTER — Encounter: Payer: Self-pay | Admitting: *Deleted

## 2015-04-20 ENCOUNTER — Other Ambulatory Visit: Payer: Self-pay | Admitting: *Deleted

## 2015-04-20 MED ORDER — ALPRAZOLAM 1 MG PO TABS: ORAL_TABLET | ORAL | Status: DC

## 2015-04-25 ENCOUNTER — Other Ambulatory Visit: Payer: Self-pay | Admitting: Internal Medicine

## 2015-04-26 ENCOUNTER — Other Ambulatory Visit: Payer: Self-pay | Admitting: *Deleted

## 2015-04-26 ENCOUNTER — Encounter: Payer: Self-pay | Admitting: Internal Medicine

## 2015-04-26 MED ORDER — TADALAFIL 20 MG PO TABS: 20.0000 mg | ORAL_TABLET | Freq: Every day | ORAL | Status: AC | PRN

## 2015-05-08 ENCOUNTER — Encounter: Payer: Self-pay | Admitting: Internal Medicine

## 2015-05-08 ENCOUNTER — Ambulatory Visit (INDEPENDENT_AMBULATORY_CARE_PROVIDER_SITE_OTHER): Payer: BLUE CROSS/BLUE SHIELD | Admitting: Physician Assistant

## 2015-05-08 VITALS — BP 124/82 | HR 64 | Temp 97.8°F | Resp 16 | Ht 68.25 in | Wt 246.6 lb

## 2015-05-08 DIAGNOSIS — E782 Mixed hyperlipidemia: Secondary | ICD-10-CM | POA: Diagnosis not present

## 2015-05-08 DIAGNOSIS — N182 Chronic kidney disease, stage 2 (mild): Secondary | ICD-10-CM

## 2015-05-08 DIAGNOSIS — Z79899 Other long term (current) drug therapy: Secondary | ICD-10-CM

## 2015-05-08 DIAGNOSIS — E559 Vitamin D deficiency, unspecified: Secondary | ICD-10-CM | POA: Diagnosis not present

## 2015-05-08 DIAGNOSIS — E1122 Type 2 diabetes mellitus with diabetic chronic kidney disease: Secondary | ICD-10-CM

## 2015-05-08 DIAGNOSIS — E039 Hypothyroidism, unspecified: Secondary | ICD-10-CM | POA: Diagnosis not present

## 2015-05-08 DIAGNOSIS — I10 Essential (primary) hypertension: Secondary | ICD-10-CM | POA: Diagnosis not present

## 2015-05-08 LAB — CBC WITH DIFFERENTIAL/PLATELET
BASOS ABS: 0.1 10*3/uL (ref 0.0–0.1)
Basophils Relative: 1 % (ref 0–1)
EOS PCT: 2 % (ref 0–5)
Eosinophils Absolute: 0.2 10*3/uL (ref 0.0–0.7)
HEMATOCRIT: 40.3 % (ref 39.0–52.0)
HEMOGLOBIN: 13.8 g/dL (ref 13.0–17.0)
LYMPHS ABS: 1.7 10*3/uL (ref 0.7–4.0)
LYMPHS PCT: 23 % (ref 12–46)
MCH: 29.4 pg (ref 26.0–34.0)
MCHC: 34.2 g/dL (ref 30.0–36.0)
MCV: 85.7 fL (ref 78.0–100.0)
MPV: 9.9 fL (ref 8.6–12.4)
Monocytes Absolute: 0.6 10*3/uL (ref 0.1–1.0)
Monocytes Relative: 8 % (ref 3–12)
NEUTROS ABS: 5 10*3/uL (ref 1.7–7.7)
NEUTROS PCT: 66 % (ref 43–77)
Platelets: 315 10*3/uL (ref 150–400)
RBC: 4.7 MIL/uL (ref 4.22–5.81)
RDW: 13.7 % (ref 11.5–15.5)
WBC: 7.5 10*3/uL (ref 4.0–10.5)

## 2015-05-08 LAB — HEMOGLOBIN A1C
Hgb A1c MFr Bld: 6.9 % — ABNORMAL HIGH (ref <5.7)
MEAN PLASMA GLUCOSE: 151 mg/dL — AB (ref <117)

## 2015-05-08 NOTE — Patient Instructions (Addendum)
Add ENTERIC COATED low dose 81 mg Aspirin daily OR can do every other day if you have easy bruising to protect your heart and head. As well as to reduce risk of Colon Cancer by 20 %, Skin Cancer by 26 % , Melanoma by 46% and Pancreatic cancer by 60%   Diabetes is a very complicated disease...lets simplify it.  An easy way to look at it to understand the complications is if you think of the extra sugar floating in your blood stream as glass shards floating through your blood stream.    Diabetes affects your small vessels first: 1) The glass shards (sugar) scraps down the tiny blood vessels in your eyes and lead to diabetic retinopathy, the leading cause of blindness in the [place]. Diabetes is the leading cause of newly diagnosed adult (54 to 47 years of age) blindness in the [place].  2) The glass shards scratches down the tiny vessels of your legs leading to nerve damage called neuropathy and can lead to amputations of your feet. More than 60% of all non-traumatic amputations of lower limbs occur in people with diabetes.  3) Over time the small vessels in your brain are shredded and closed off, individually this does not cause any problems but over a long period of time many of the small vessels being blocked can lead to Vascular Dementia.   4) Your kidney's are a filter system and have a "net" that keeps certain things in the body and lets bad things out. Sugar shreds this net and leads to kidney damage and eventually failure. Decreasing the sugar that is destroying the net and certain blood pressure medications can help stop or decrease progression of kidney disease. Diabetes was the primary cause of kidney failure in 44 percent of all new cases in 2011. Normal GFR is greater than 90.   Lab Results  Component Value Date   GFRNONAA 67 12/07/2014    5) Diabetes also destroys the small vessels in your penis that lead to erectile dysfunction. Eventually the vessels are so damaged that you may  not be responsive to cialis or viagra.   Diabetes and your large vessels: Your larger vessels consist of your coronary arteries in your heart and the carotid vessels to your brain. Diabetes or even increased sugars put you at 300% increased risk of heart attack and stroke and this is why.. The sugar scrapes down your large blood vessels and your body sees this as an internal injury and tries to repair itself. Just like you get a scab on your skin, your platelets will stick to the blood vessel wall trying to heal it. This is why we have diabetics on low dose aspirin daily, this prevents the platelets from sticking and can prevent plaque formation. In addition, your body takes cholesterol and tries to shove it into the open wound. This is why we want your LDL, or bad cholesterol, below 70.   The combination of platelets and cholesterol over 5-10 years forms plaque that can break off and cause a heart attack or stroke.   PLEASE REMEMBER:  Diabetes is preventable! Up to 85 percent of complications and morbidities among individuals with type 2 diabetes can be prevented, delayed, or effectively treated and minimized with regular visits to a health professional, appropriate monitoring and medication, and a healthy diet and lifestyle.   Your A1C is a measure of your sugar over the past 3 months and is not affected by what you have eaten over the  past few days. Diabetes increases your chances of stroke and heart attack over 300 % and is the leading cause of blindness and kidney failure in the [place]. Please make sure you decrease bad carbs like white bread, white rice, potatoes, corn, soft drinks, pasta, cereals, refined sugars, sweet tea, dried fruits, and fruit juice. Good carbs are okay to eat in moderation like sweet potatoes, brown rice, whole grain pasta/bread, most fruit (except dried fruit) and you can eat as many veggies as you want.   Greater than 6.5 is considered diabetic. Between 6.4 and 5.7  is prediabetic If your A1C is less than 5.7 you are NOT diabetic. Your last A1C  Lab Results  Component Value Date   HGBA1C 7.1* 12/07/2014    Targets for Glucose Readings: Time of Check Target for patients WITHOUT Diabetes Target for DIABETICS  Before Meals Less than 100  less than 150  Two hours after meals Less than 200  Less than 250       Bad carbs also include fruit juice, alcohol, and sweet tea. These are empty calories that do not signal to your brain that you are full.   Please remember the good carbs are still carbs which convert into sugar. So please measure them out no more than 1/2-1 cup of rice, oatmeal, pasta, and beans  Veggies are however free foods! Pile them on.   Not all fruit is created equal. Please see the list below, the fruit at the bottom is higher in sugars than the fruit at the top. Please avoid all dried fruits.     We want weight loss that will last so you should lose 1-2 pounds a week.  THAT IS IT! Please pick THREE things a month to change. Once it is a habit check off the item. Then pick another three items off the list to become habits.  If you are already doing a habit on the list GREAT!  Cross that item off! o Don't drink your calories. Ie, alcohol, soda, fruit juice, and sweet tea.  o Drink more water. Drink a glass when you feel hungry or before each meal.  o Eat breakfast - Complex carb and protein (likeDannon light and fit yogurt, oatmeal, fruit, eggs, [place]key bacon). o Measure your cereal.  Eat no more than one cup a day. (ie [place]ashi) o Eat an apple a day. o Add a vegetable a day. o Try a new vegetable a month. o Use Pam! Stop using oil or butter to cook. o Don't finish your plate or use smaller plates. o Share your dessert. o Eat sugar free Jello for dessert or frozen grapes. o Don't eat 2-3 hours before bed. o Switch to whole wheat bread, pasta, and brown rice. o Make healthier choices when you eat out. No fries! o Pick baked chicken,  NOT fried. o Don't forget to SLOW DOWN when you eat. It is not going anywhere.  o Take the stairs. o Park far away in the parking lot o [employer] soup cans (or weights) for 10 minutes while watching TV. o Walk at work for 10 minutes during break. o Walk outside 1 time a week with your friend, kids, dog, or significant other. o Start a walking group at church. o Walk the mall as much as you can tolerate.  o Keep a food diary. o Weigh yourself daily. o Walk for 15 minutes 3 days per week. o Cook at home more often and eat out less.  If life  happens and you go back to old habits, it is okay.  Just start over. You can do it!   If you experience chest pain, get short of breath, or tired during the exercise, please stop immediately and inform your doctor.

## 2015-05-08 NOTE — Progress Notes (Signed)
Assessment and Plan:  1. Hypertension -Continue medication, monitor blood pressure at home. Continue DASH diet.  Reminder to go to the ER if any CP, SOB, nausea, dizziness, severe HA, changes vision/speech, left arm numbness and tingling and jaw pain.  2. Cholesterol -Continue diet and exercise. Check cholesterol.   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 Check A1C  4. Vitamin D Def - check level and continue medications.   5. Hypothyroidism -check TSH level, continue medications the same, reminded to take on an empty stomach 30-7660mins before food.   6. Obesity with co morbidities - long discussion about weight loss, diet, and exercise   Continue diet and meds as discussed. Further disposition pending results of labs. Discussed med's effects and SE's.    Over 30 minutes of exam, counseling, chart review, and critical decision making was performed   HPI 49 y.o. male  presents for 3 month follow up on hypertension, cholesterol, diabetes and vitamin D deficiency.   His blood pressure has been controlled at home, today his BP is BP: 124/82 mmHg.  He does not workout. He denies chest pain, shortness of breath, dizziness.  He is on cholesterol medication and denies myalgias. His cholesterol is not at goal. The cholesterol was:   Lab Results  Component Value Date   CHOL 201* 12/07/2014   HDL 26* 12/07/2014   LDLCALC NOT CALC 12/07/2014   TRIG 412* 12/07/2014   CHOLHDL 7.7 12/07/2014    He has been working on diet and exercise for diabetes with diabetic chronic kidney disease, he is not on bASA, he is not on ACE/ARB, and denies  paresthesia of the feet, polydipsia, polyuria and visual disturbances. Last A1C was:  Lab Results  Component Value Date   HGBA1C 7.1* 12/07/2014    Lab Results  Component Value Date   GFRNONAA 67 12/07/2014    Patient is on Vitamin D supplement. Lab Results  Component Value Date   VD25OH 5757 12/07/2014   He is  on thyroid medication. His medication was not changed last visit.   Lab Results  Component Value Date   TSH 1.927 12/07/2014  .  BMI is Body mass index is 37.2 kg/(m^2)., he is working on diet and exercise, has lost 10 lbs., he has prescription for phentermine but not on it at this time. He is going to start on testosterone injections with urology.  Wt Readings from Last 3 Encounters:  05/08/15 246 lb 9.6 oz (111.857 kg)  12/07/14 255 lb (115.667 kg)  09/07/14 253 lb 6.4 oz (114.941 kg)   He has a history of testosterone deficiency and is getting on testosterone replacement with urology. He states that the testosterone helps with his energy, libido, muscle mass. Lab Results  Component Value Date   TESTOSTERONE 195* 12/07/2014     Current Medications:  Current Outpatient Prescriptions on File Prior to Visit  Medication Sig Dispense Refill  . ALPRAZolam (XANAX) 1 MG tablet TAKE 1/2-1 TABLET 3 TIMES A DAY AS NEEDED FOR ANXIETY 90 tablet 3  . atenolol (TENORMIN) 100 MG tablet TAKE 1 TABLET BY MOUTH EVERY DAY FOR BLOOD PRESSURE 90 tablet 1  . atorvastatin (LIPITOR) 80 MG tablet Take 1 tablet (80 mg total) by mouth daily. 30 tablet 3  . buPROPion (WELLBUTRIN XL) 300 MG 24 hr tablet Take 1 tablet (300 mg total) by mouth daily. 30 tablet 3  . citalopram (CELEXA) 40 MG tablet Take 1 tablet daily for Mood 90 tablet 1  .  fenofibrate micronized (LOFIBRA) 134 MG capsule Take 1 capsule (134 mg total) by mouth daily. 30 capsule 3  . glucose blood (FREESTYLE LITE) test strip Check blood sugar 1 time a day.  DX-E11.9 100 each 1  . levothyroxine (SYNTHROID, LEVOTHROID) 100 MCG tablet TAKE 1 TABLET BY MOUTH EVERY DAY OR AS DIRECTED 90 tablet 1  . metFORMIN (GLUCOPHAGE-XR) 500 MG 24 hr tablet TAKE 1 TABLET BY MOUTH DAILY WITH BREAKFAST & LUNCH AND 2 TABLETS WITH SUPPER 360 tablet 1  . phentermine (ADIPEX-P) 37.5 MG tablet TAKE 1/2-1 TABLET EVERY MORNING FOR DIET AND WIEGHT LOSS 30 tablet 0  . tadalafil  (CIALIS) 20 MG tablet Take 1 tablet (20 mg total) by mouth daily as needed for erectile dysfunction. 3 tablet 0  . Vitamin D, Ergocalciferol, (DRISDOL) 50000 UNITS CAPS capsule TAKE 1 CAPSULE DAILY OR AS DIRECTED FOR SEVERE VITAMIN D DEFICIENCY 30 capsule 3   No current facility-administered medications on file prior to visit.   Medical History:  Past Medical History  Diagnosis Date  . Hyperlipidemia   . Hypertension   . Hypothyroidism   . Depression   . Hypogonadism male   . Pre-diabetes   . Vitamin D deficiency    Allergies:  Allergies  Allergen Reactions  . Citalopram Diarrhea  . Sertraline      Review of Systems:  Review of Systems  Constitutional: Negative.   HENT: Negative.   Eyes: Negative.   Respiratory: Negative.   Cardiovascular: Negative.   Gastrointestinal: Negative.   Genitourinary: Negative.   Musculoskeletal: Negative.   Skin: Negative.   Neurological: Negative.   Endo/Heme/Allergies: Negative.   Psychiatric/Behavioral: Negative.     Family history- Review and unchanged Social history- Review and unchanged Physical Exam: BP 124/82 mmHg  Pulse 64  Temp(Src) 97.8 F (36.6 C)  Resp 16  Ht 5' 8.25" (1.734 m)  Wt 246 lb 9.6 oz (111.857 kg)  BMI 37.20 kg/m2 Wt Readings from Last 3 Encounters:  05/08/15 246 lb 9.6 oz (111.857 kg)  12/07/14 255 lb (115.667 kg)  09/07/14 253 lb 6.4 oz (114.941 kg)   General Appearance: Well nourished, in no apparent distress. Eyes: PERRLA, EOMs, conjunctiva no swelling or erythema Sinuses: No Frontal/maxillary tenderness ENT/Mouth: Ext aud canals clear, TMs without erythema, bulging. No erythema, swelling, or exudate on post pharynx.  Tonsils not swollen or erythematous. Hearing normal.  Neck: Supple, thyroid normal.  Respiratory: Respiratory effort normal, BS equal bilaterally without rales, rhonchi, wheezing or stridor.  Cardio: RRR with no MRGs. Brisk peripheral pulses without edema.  Abdomen: Soft, + BS.  Non  tender, no guarding, rebound, hernias, masses. Lymphatics: Non tender without lymphadenopathy.  Musculoskeletal: Full ROM, 5/5 strength, Normal gait Skin: Warm, dry without rashes, lesions, ecchymosis.  Neuro: Cranial nerves intact. No cerebellar symptoms.  Psych: Awake and oriented X 3, normal affect, Insight and Judgment appropriate.    Quentin Mulling, PA-C 12:20 PM [facility]

## 2015-05-09 LAB — BASIC METABOLIC PANEL WITH GFR
BUN: 15 mg/dL (ref 7–25)
CHLORIDE: 102 mmol/L (ref 98–110)
CO2: 23 mmol/L (ref 20–31)
Calcium: 9.9 mg/dL (ref 8.6–10.3)
Creat: 1.11 mg/dL (ref 0.60–1.35)
GFR, EST NON AFRICAN AMERICAN: 79 mL/min (ref >=60)
GLUCOSE: 85 mg/dL (ref 65–99)
POTASSIUM: 4.4 mmol/L (ref 3.5–5.3)
Sodium: 139 mmol/L (ref 135–146)

## 2015-05-09 LAB — HEPATIC FUNCTION PANEL
ALK PHOS: 46 U/L (ref 40–115)
ALT: 22 U/L (ref 9–46)
AST: 25 U/L (ref 10–40)
Albumin: 4.6 g/dL (ref 3.6–5.1)
BILIRUBIN INDIRECT: 0.5 mg/dL (ref 0.2–1.2)
Bilirubin, Direct: 0.1 mg/dL (ref <=0.2)
TOTAL PROTEIN: 6.9 g/dL (ref 6.1–8.1)
Total Bilirubin: 0.6 mg/dL (ref 0.2–1.2)

## 2015-05-09 LAB — LIPID PANEL
Cholesterol: 128 mg/dL (ref 125–200)
HDL: 28 mg/dL — ABNORMAL LOW (ref >=40)
LDL CALC: 76 mg/dL (ref <130)
TRIGLYCERIDES: 119 mg/dL (ref <150)
Total CHOL/HDL Ratio: 4.6 Ratio (ref <=5.0)
VLDL: 24 mg/dL (ref <30)

## 2015-05-09 LAB — VITAMIN D 25 HYDROXY (VIT D DEFICIENCY, FRACTURES): VIT D 25 HYDROXY: 124 ng/mL — AB (ref 30–100)

## 2015-05-09 LAB — TSH: TSH: 1.482 u[IU]/mL (ref 0.350–4.500)

## 2015-05-09 LAB — MAGNESIUM: MAGNESIUM: 1.6 mg/dL (ref 1.5–2.5)

## 2015-05-09 LAB — INSULIN, FASTING: INSULIN FASTING, SERUM: 9.2 u[IU]/mL (ref 2.0–19.6)

## 2015-06-08 ENCOUNTER — Other Ambulatory Visit: Payer: Self-pay | Admitting: Internal Medicine

## 2015-06-27 ENCOUNTER — Other Ambulatory Visit: Payer: Self-pay | Admitting: Internal Medicine

## 2015-06-30 ENCOUNTER — Other Ambulatory Visit: Payer: Self-pay | Admitting: Internal Medicine

## 2015-08-01 ENCOUNTER — Encounter: Payer: Self-pay | Admitting: *Deleted

## 2015-08-17 ENCOUNTER — Other Ambulatory Visit: Payer: Self-pay | Admitting: Internal Medicine

## 2015-08-17 DIAGNOSIS — F419 Anxiety disorder, unspecified: Secondary | ICD-10-CM

## 2015-09-04 ENCOUNTER — Other Ambulatory Visit: Payer: Self-pay | Admitting: Internal Medicine

## 2015-09-16 ENCOUNTER — Encounter: Payer: Self-pay | Admitting: Internal Medicine

## 2015-09-16 ENCOUNTER — Other Ambulatory Visit: Payer: Self-pay | Admitting: Internal Medicine

## 2015-09-16 NOTE — Progress Notes (Signed)
RESCHEDULED

## 2015-09-17 ENCOUNTER — Ambulatory Visit: Payer: Self-pay | Admitting: Internal Medicine

## 2015-09-27 ENCOUNTER — Other Ambulatory Visit: Payer: Self-pay | Admitting: Internal Medicine

## 2015-12-20 ENCOUNTER — Other Ambulatory Visit: Payer: Self-pay | Admitting: Physician Assistant

## 2016-01-02 ENCOUNTER — Encounter: Payer: Self-pay | Admitting: Internal Medicine

## 2017-05-29 DIAGNOSIS — E119 Type 2 diabetes mellitus without complications: Secondary | ICD-10-CM | POA: Diagnosis not present

## 2017-05-29 DIAGNOSIS — H524 Presbyopia: Secondary | ICD-10-CM | POA: Diagnosis not present

## 2017-05-29 DIAGNOSIS — H52203 Unspecified astigmatism, bilateral: Secondary | ICD-10-CM | POA: Diagnosis not present

## 2017-08-10 DIAGNOSIS — R05 Cough: Secondary | ICD-10-CM | POA: Diagnosis not present

## 2017-08-10 DIAGNOSIS — J029 Acute pharyngitis, unspecified: Secondary | ICD-10-CM | POA: Diagnosis not present

## 2017-11-13 DIAGNOSIS — E1165 Type 2 diabetes mellitus with hyperglycemia: Secondary | ICD-10-CM | POA: Diagnosis not present

## 2017-11-13 DIAGNOSIS — I10 Essential (primary) hypertension: Secondary | ICD-10-CM | POA: Diagnosis not present

## 2017-11-13 DIAGNOSIS — Z6837 Body mass index (BMI) 37.0-37.9, adult: Secondary | ICD-10-CM | POA: Diagnosis not present

## 2018-01-22 DIAGNOSIS — E1122 Type 2 diabetes mellitus with diabetic chronic kidney disease: Secondary | ICD-10-CM | POA: Diagnosis not present

## 2018-01-22 DIAGNOSIS — I10 Essential (primary) hypertension: Secondary | ICD-10-CM | POA: Diagnosis not present

## 2018-01-22 DIAGNOSIS — Z6835 Body mass index (BMI) 35.0-35.9, adult: Secondary | ICD-10-CM | POA: Diagnosis not present

## 2018-01-22 DIAGNOSIS — E1165 Type 2 diabetes mellitus with hyperglycemia: Secondary | ICD-10-CM | POA: Diagnosis not present

## 2018-02-23 DIAGNOSIS — E559 Vitamin D deficiency, unspecified: Secondary | ICD-10-CM | POA: Diagnosis not present

## 2018-02-23 DIAGNOSIS — E291 Testicular hypofunction: Secondary | ICD-10-CM | POA: Diagnosis not present

## 2018-02-23 DIAGNOSIS — R82998 Other abnormal findings in urine: Secondary | ICD-10-CM | POA: Diagnosis not present

## 2018-02-23 DIAGNOSIS — E1165 Type 2 diabetes mellitus with hyperglycemia: Secondary | ICD-10-CM | POA: Diagnosis not present

## 2018-02-23 DIAGNOSIS — E038 Other specified hypothyroidism: Secondary | ICD-10-CM | POA: Diagnosis not present

## 2018-02-23 DIAGNOSIS — Z0000 Encounter for general adult medical examination without abnormal findings: Secondary | ICD-10-CM | POA: Diagnosis not present

## 2018-02-23 DIAGNOSIS — Z125 Encounter for screening for malignant neoplasm of prostate: Secondary | ICD-10-CM | POA: Diagnosis not present

## 2018-03-02 DIAGNOSIS — E668 Other obesity: Secondary | ICD-10-CM | POA: Diagnosis not present

## 2018-03-02 DIAGNOSIS — B354 Tinea corporis: Secondary | ICD-10-CM | POA: Diagnosis not present

## 2018-03-02 DIAGNOSIS — Z6836 Body mass index (BMI) 36.0-36.9, adult: Secondary | ICD-10-CM | POA: Diagnosis not present

## 2018-03-02 DIAGNOSIS — E1122 Type 2 diabetes mellitus with diabetic chronic kidney disease: Secondary | ICD-10-CM | POA: Diagnosis not present

## 2018-03-02 DIAGNOSIS — Z0000 Encounter for general adult medical examination without abnormal findings: Secondary | ICD-10-CM | POA: Diagnosis not present

## 2018-03-02 DIAGNOSIS — I10 Essential (primary) hypertension: Secondary | ICD-10-CM | POA: Diagnosis not present

## 2018-04-28 DIAGNOSIS — B360 Pityriasis versicolor: Secondary | ICD-10-CM | POA: Diagnosis not present

## 2018-04-28 DIAGNOSIS — L304 Erythema intertrigo: Secondary | ICD-10-CM | POA: Diagnosis not present

## 2018-06-03 DIAGNOSIS — H5212 Myopia, left eye: Secondary | ICD-10-CM | POA: Diagnosis not present

## 2018-06-03 DIAGNOSIS — E119 Type 2 diabetes mellitus without complications: Secondary | ICD-10-CM | POA: Diagnosis not present

## 2018-06-14 DIAGNOSIS — Z6836 Body mass index (BMI) 36.0-36.9, adult: Secondary | ICD-10-CM | POA: Diagnosis not present

## 2018-06-14 DIAGNOSIS — A084 Viral intestinal infection, unspecified: Secondary | ICD-10-CM | POA: Diagnosis not present

## 2018-06-14 DIAGNOSIS — R197 Diarrhea, unspecified: Secondary | ICD-10-CM | POA: Diagnosis not present

## 2018-07-26 ENCOUNTER — Emergency Department (HOSPITAL_COMMUNITY)
Admission: EM | Admit: 2018-07-26 | Discharge: 2018-07-26 | Disposition: A | Discharge status: Home / Self Care | Payer: BLUE CROSS/BLUE SHIELD | Attending: Emergency Medicine | Admitting: Emergency Medicine

## 2018-07-26 ENCOUNTER — Encounter (HOSPITAL_COMMUNITY): Payer: Self-pay | Admitting: *Deleted

## 2018-07-26 ENCOUNTER — Emergency Department (HOSPITAL_COMMUNITY): Payer: BLUE CROSS/BLUE SHIELD

## 2018-07-26 DIAGNOSIS — N182 Chronic kidney disease, stage 2 (mild): Secondary | ICD-10-CM | POA: Insufficient documentation

## 2018-07-26 DIAGNOSIS — I129 Hypertensive chronic kidney disease with stage 1 through stage 4 chronic kidney disease, or unspecified chronic kidney disease: Secondary | ICD-10-CM | POA: Insufficient documentation

## 2018-07-26 DIAGNOSIS — E1122 Type 2 diabetes mellitus with diabetic chronic kidney disease: Secondary | ICD-10-CM | POA: Diagnosis not present

## 2018-07-26 DIAGNOSIS — R0789 Other chest pain: Secondary | ICD-10-CM | POA: Insufficient documentation

## 2018-07-26 DIAGNOSIS — Z7984 Long term (current) use of oral hypoglycemic drugs: Secondary | ICD-10-CM | POA: Diagnosis not present

## 2018-07-26 DIAGNOSIS — Z79899 Other long term (current) drug therapy: Secondary | ICD-10-CM | POA: Insufficient documentation

## 2018-07-26 DIAGNOSIS — R079 Chest pain, unspecified: Secondary | ICD-10-CM | POA: Diagnosis not present

## 2018-07-26 DIAGNOSIS — E039 Hypothyroidism, unspecified: Secondary | ICD-10-CM | POA: Insufficient documentation

## 2018-07-26 LAB — CBC
HEMATOCRIT: 49 % (ref 39.0–52.0)
Hemoglobin: 16.4 g/dL (ref 13.0–17.0)
MCH: 29.3 pg (ref 26.0–34.0)
MCHC: 33.5 g/dL (ref 30.0–36.0)
MCV: 87.7 fL (ref 80.0–100.0)
Platelets: 287 10*3/uL (ref 150–400)
RBC: 5.59 MIL/uL (ref 4.22–5.81)
RDW: 13.2 % (ref 11.5–15.5)
WBC: 6.4 10*3/uL (ref 4.0–10.5)
nRBC: 0 % (ref 0.0–0.2)

## 2018-07-26 LAB — BASIC METABOLIC PANEL
Anion gap: 12 (ref 5–15)
BUN: 14 mg/dL (ref 6–20)
CO2: 22 mmol/L (ref 22–32)
Calcium: 9.4 mg/dL (ref 8.9–10.3)
Chloride: 104 mmol/L (ref 98–111)
Creatinine, Ser: 1.33 mg/dL — ABNORMAL HIGH (ref 0.61–1.24)
GFR calc Af Amer: >60 mL/min (ref >60)
GFR calc non Af Amer: >60 mL/min (ref >60)
GLUCOSE: 148 mg/dL — AB (ref 70–99)
Potassium: 4 mmol/L (ref 3.5–5.1)
Sodium: 138 mmol/L (ref 135–145)

## 2018-07-26 LAB — I-STAT TROPONIN, ED
Troponin i, poc: 0 ng/mL (ref 0.00–0.08)
Troponin i, poc: 0 ng/mL (ref 0.00–0.08)

## 2018-07-26 MED ORDER — SODIUM CHLORIDE 0.9% FLUSH: 3.0000 mL | Freq: Once | INTRAVENOUS | Status: DC

## 2018-07-26 NOTE — ED Provider Notes (Signed)
[facility] Provider Note   CSN: [id number] Arrival date & time: 07/26/18  [id number]     History   Chief Complaint Chief Complaint  Patient presents with  . Chest Pain    HPI Juan Campos is a 53 y.o. male.  HPI   Juan Campos is a 53 y.o. male, with a history of HTN, DM, and hypothyroidism, presenting to the ED with chest pain that came on today around 8:15 AM while at rest.  Pain was sharp and shooting across the top of the chest with no further radiation.  Lasted for approximately 15 minutes, moderate in intensity, then resolved and has not recurred.  He has had this issue once before.  Denies fever/chills, cough, shortness of breath, diaphoresis, dizziness, syncope, orthopnea, lower extremity edema or pain, abdominal pain, N/V/D, or any other complaints.     Past Medical History:  Diagnosis Date  . Depression   . Hyperlipidemia   . Hypertension   . Hypogonadism male   . Hypothyroidism   . Pre-diabetes   . Vitamin D deficiency     Patient Active Problem List   Diagnosis Date Noted  . Essential hypertension 03/21/2014  . Morbid Obesity (BMI 35.8) 03/21/2014  . Medication management 03/21/2014  . Hyperlipidemia 07/12/2013  . Hypothyroidism   . Testosterone Deficiency   . Vitamin D deficiency   . CKD stage 2 due to type 2 diabetes mellitus (HCC) 05/12/2013    History reviewed. No pertinent surgical history.      Home Medications    Prior to Admission medications   Medication Sig Start Date End Date Taking? Authorizing Provider  ALPRAZolam Prudy Feeler(XANAX) 1 MG tablet TAKE 1/2-1 TABLET BY MOUTH 3 TIMES A DAY AS NEEDED FOR ANXIETY 08/17/15   Lucky CowboyMcKeown, William, MD  atenolol (TENORMIN) 100 MG tablet TAKE 1 TABLET BY MOUTH EVERY DAY FOR BLOOD PRESSURE 06/08/15   Quentin Mullingollier, Amanda, PA-C  atorvastatin (LIPITOR) 80 MG tablet Take 1 tablet (80 mg total) by mouth daily. 12/07/14   Lucky CowboyMcKeown, William, MD  buPROPion (WELLBUTRIN XL) 300 MG 24 hr tablet  TAKE 1 TAB BY MOUTH DAILY. 09/04/15   Lucky CowboyMcKeown, William, MD  citalopram (CELEXA) 40 MG tablet TAKE 1 TABLET DAILY FOR MOOD 09/16/15   Lucky CowboyMcKeown, William, MD  fenofibrate micronized (LOFIBRA) 134 MG capsule TAKE ONE CAPSULE BY MOUTH EVERY DAY 09/04/15   Lucky CowboyMcKeown, William, MD  glucose blood (FREESTYLE LITE) test strip Check blood sugar 1 time a day.  DX-E11.9 09/12/14   Lucky CowboyMcKeown, William, MD  levothyroxine (SYNTHROID, LEVOTHROID) 100 MCG tablet TAKE 1 TABLET BY MOUTH EVERY DAY OR AS DIRECTED 06/08/15   Quentin Mullingollier, Amanda, PA-C  metFORMIN (GLUCOPHAGE-XR) 500 MG 24 hr tablet TAKE 1 TABLET BY MOUTH DAILY WITH BREAKFAST & LUNCH AND 2 TABLETS WITH SUPPER 12/12/14   Lucky CowboyMcKeown, William, MD  phentermine (ADIPEX-P) 37.5 MG tablet TAKE 1/2 TO 1 TABLET BY MOUTH EVERY MORNING FOR DIET 09/04/15   Lucky CowboyMcKeown, William, MD  tadalafil (CIALIS) 20 MG tablet Take 1 tablet (20 mg total) by mouth daily as needed for erectile dysfunction. 04/26/15   Lucky CowboyMcKeown, William, MD  Vitamin D, Ergocalciferol, (DRISDOL) 50000 units CAPS capsule TAKE 1 CAPSULE DAILY OR AS DIRECTED FOR SEVERE VITAMIN D DEFICIENCY 09/04/15   Lucky CowboyMcKeown, William, MD    Family History Family History  Problem Relation Age of Onset  . Hyperlipidemia Mother   . Hypertension Mother   . Hypertension Sister   . Colon cancer Maternal Grandmother     Social  History Social History   Tobacco Use  . Smoking status: Never Smoker  . Smokeless tobacco: Never Used  Substance Use Topics  . Alcohol use: No  . Drug use: No     Allergies   Citalopram and Sertraline   Review of Systems Review of Systems  Constitutional: Negative for chills, diaphoresis and fever.  Respiratory: Negative for cough and shortness of breath.   Cardiovascular: Positive for chest pain (resolved). Negative for palpitations and leg swelling.  Gastrointestinal: Negative for abdominal pain, diarrhea, nausea and vomiting.  Neurological: Negative for dizziness, weakness, light-headedness, numbness and  headaches.  All other systems reviewed and are negative.    Physical Exam Updated Vital Signs BP 138/87 (BP Location: Right Arm)   Pulse 71   Resp 20   SpO2 99%   Physical Exam Vitals signs and nursing note reviewed.  Constitutional:      General: He is not in acute distress.    Appearance: He is well-developed. He is not diaphoretic.  HENT:     Head: Normocephalic and atraumatic.     Mouth/Throat:     Mouth: Mucous membranes are moist.     Pharynx: Oropharynx is clear.  Eyes:     Conjunctiva/sclera: Conjunctivae normal.  Neck:     Musculoskeletal: Neck supple.  Cardiovascular:     Rate and Rhythm: Normal rate and regular rhythm.     Pulses: Normal pulses.          Radial pulses are 2+ on the right side and 2+ on the left side.       Posterior tibial pulses are 2+ on the right side and 2+ on the left side.     Heart sounds: Normal heart sounds.  Pulmonary:     Effort: Pulmonary effort is normal. No respiratory distress.     Breath sounds: Normal breath sounds.  Abdominal:     Palpations: Abdomen is soft.     Tenderness: There is no abdominal tenderness. There is no guarding.  Musculoskeletal:     Right lower leg: No edema.     Left lower leg: No edema.  Lymphadenopathy:     Cervical: No cervical adenopathy.  Skin:    General: Skin is warm and dry.  Neurological:     Mental Status: He is alert.  Psychiatric:        Mood and Affect: Mood and affect normal.        Speech: Speech normal.        Behavior: Behavior normal.      ED Treatments / Results  Labs (all labs ordered are listed, but only abnormal results are displayed) Labs Reviewed  BASIC METABOLIC PANEL - Abnormal; Notable for the following components:      Result Value   Glucose, Bld 148 (*)    Creatinine, Ser 1.33 (*)    All other components within normal limits  CBC  I-STAT TROPONIN, ED  I-STAT TROPONIN, ED    EKG EKG Interpretation  Date/Time:  Monday July 26 2018 09:42:57  EST Ventricular Rate:  91 PR Interval:  178 QRS Duration: 98 QT Interval:  362 QTC Calculation: 445 R Axis:   144 Text Interpretation:  Normal sinus rhythm Right axis deviation Anterior infarct , age undetermined Abnormal ECG no prior to compare with Confirmed by Meridee Score [phone]) on 07/26/2018 12:35:17 PM   Radiology Dg Chest 2 View  Result Date: 07/26/2018 CLINICAL DATA:  Onset chest pain this morning. EXAM: CHEST - 2 VIEW COMPARISON:  None. FINDINGS: The lungs are clear. Heart size is normal. No pneumothorax or pleural fluid. No acute or focal bony abnormality. IMPRESSION: Negative chest. Electronically Signed   By: Drusilla Kannerhomas  Dalessio M.D.   On: 07/26/2018 10:14    Procedures Procedures (including critical care time)  Medications Ordered in ED Medications  sodium chloride flush (NS) 0.9 % injection 3 mL (has no administration in time range)     Initial Impression / Assessment and Plan / ED Course  I have reviewed the triage vital signs and the nursing notes.  Pertinent labs & imaging results that were available during my care of the patient were reviewed by me and considered in my medical decision making (see chart for details).  Clinical Course as of Jul 26 1356  Mon Jul 26, 2018  [id number] 53 year old male with no prior history of CAD is here with an episode of chest pain across his upper chest with some associated diaphoresis that occurred while he was at work doing regular activity.  It is resolved now.  Said he had a similar episode about 4 5 weeks ago that also similarly resolved and he did not get checked out for that.  He is never had any kind of stress testing.  His initial EKG and troponin are unremarkable.  He is getting a delta troponin.  Likely will need outpatient follow-up for stress test.   [MB]    Clinical Course User Index [MB] Terrilee FilesButler, Michael C, MD    Patient presents with atypical sounding chest pain that resolved prior to arrival and did not recur. Low  suspicion for high risk ACS. HEART score is 2, indicating low risk for a cardiac event. Wells criteria score is 0, indicating low risk for PE.  Outpatient cardiology follow-up.  Return precautions discussed.  Patient voices understanding of these instructions, accepts the plan, and is comfortable with discharge.   Vitals:   07/26/18 1230 07/26/18 1300  BP: 138/87 122/89  Pulse: 71 70  Resp: 20 16  SpO2: 99% 96%     Final Clinical Impressions(s) / ED Diagnoses   Final diagnoses:  Atypical chest pain    ED Discharge Orders    None       Concepcion LivingJoy, Shawn C, PA-C 07/26/18 1359    Terrilee FilesButler, Michael C, MD 07/27/18 [phone]

## 2018-07-26 NOTE — Discharge Instructions (Signed)
Continue to take your medications, as prescribed. Follow-up with cardiology on this matter. Return to the ED for recurrence.

## 2018-07-26 NOTE — ED Triage Notes (Signed)
Pt in c/o chest pain that started around 8 this morning, lasted around 45 minutes, denies pain at this time, history of same but the symptoms didn't last as long, no distress noted

## 2018-08-30 DIAGNOSIS — E1122 Type 2 diabetes mellitus with diabetic chronic kidney disease: Secondary | ICD-10-CM | POA: Diagnosis not present

## 2018-08-30 DIAGNOSIS — N183 Chronic kidney disease, stage 3 (moderate): Secondary | ICD-10-CM | POA: Diagnosis not present

## 2018-08-30 DIAGNOSIS — I10 Essential (primary) hypertension: Secondary | ICD-10-CM | POA: Diagnosis not present

## 2018-08-30 DIAGNOSIS — E668 Other obesity: Secondary | ICD-10-CM | POA: Diagnosis not present

## 2019-03-18 DIAGNOSIS — E1122 Type 2 diabetes mellitus with diabetic chronic kidney disease: Secondary | ICD-10-CM | POA: Diagnosis not present

## 2019-03-18 DIAGNOSIS — E7849 Other hyperlipidemia: Secondary | ICD-10-CM | POA: Diagnosis not present

## 2019-03-18 DIAGNOSIS — Z125 Encounter for screening for malignant neoplasm of prostate: Secondary | ICD-10-CM | POA: Diagnosis not present

## 2019-03-18 DIAGNOSIS — E559 Vitamin D deficiency, unspecified: Secondary | ICD-10-CM | POA: Diagnosis not present

## 2019-03-18 DIAGNOSIS — E291 Testicular hypofunction: Secondary | ICD-10-CM | POA: Diagnosis not present

## 2019-03-18 DIAGNOSIS — Z0000 Encounter for general adult medical examination without abnormal findings: Secondary | ICD-10-CM | POA: Diagnosis not present

## 2019-03-25 DIAGNOSIS — E1122 Type 2 diabetes mellitus with diabetic chronic kidney disease: Secondary | ICD-10-CM | POA: Diagnosis not present

## 2019-03-25 DIAGNOSIS — Z23 Encounter for immunization: Secondary | ICD-10-CM | POA: Diagnosis not present

## 2019-03-25 DIAGNOSIS — Z1331 Encounter for screening for depression: Secondary | ICD-10-CM | POA: Diagnosis not present

## 2019-03-25 DIAGNOSIS — I10 Essential (primary) hypertension: Secondary | ICD-10-CM | POA: Diagnosis not present

## 2019-03-25 DIAGNOSIS — N183 Chronic kidney disease, stage 3 unspecified: Secondary | ICD-10-CM | POA: Diagnosis not present

## 2019-03-25 DIAGNOSIS — Z0000 Encounter for general adult medical examination without abnormal findings: Secondary | ICD-10-CM | POA: Diagnosis not present

## 2019-03-25 DIAGNOSIS — I129 Hypertensive chronic kidney disease with stage 1 through stage 4 chronic kidney disease, or unspecified chronic kidney disease: Secondary | ICD-10-CM | POA: Diagnosis not present

## 2019-03-25 DIAGNOSIS — E669 Obesity, unspecified: Secondary | ICD-10-CM | POA: Diagnosis not present

## 2019-03-25 DIAGNOSIS — R82998 Other abnormal findings in urine: Secondary | ICD-10-CM | POA: Diagnosis not present

## 2019-06-13 DIAGNOSIS — E119 Type 2 diabetes mellitus without complications: Secondary | ICD-10-CM | POA: Diagnosis not present

## 2019-07-26 DIAGNOSIS — I129 Hypertensive chronic kidney disease with stage 1 through stage 4 chronic kidney disease, or unspecified chronic kidney disease: Secondary | ICD-10-CM | POA: Diagnosis not present

## 2019-07-26 DIAGNOSIS — E1122 Type 2 diabetes mellitus with diabetic chronic kidney disease: Secondary | ICD-10-CM | POA: Diagnosis not present

## 2019-07-26 DIAGNOSIS — N183 Chronic kidney disease, stage 3 unspecified: Secondary | ICD-10-CM | POA: Diagnosis not present

## 2019-09-10 ENCOUNTER — Ambulatory Visit: Payer: BC Managed Care – PPO | Attending: Internal Medicine

## 2019-09-10 DIAGNOSIS — Z23 Encounter for immunization: Secondary | ICD-10-CM

## 2019-09-10 NOTE — Progress Notes (Signed)
   Covid-19 Vaccination Clinic  Name:  Admir Candelas    MRN: [id number] DOB: [birth year]  09/10/2019  Mr. Neeson was observed post Covid-19 immunization for 15 minutes without incident. He was provided with Vaccine Information Sheet and instruction to access the V-Safe system.   Mr. Colao was instructed to call 911 with any severe reactions post vaccine: Marland Kitchen Difficulty breathing  . Swelling of face and throat  . A fast heartbeat  . A bad rash all over body  . Dizziness and weakness   Immunizations Administered    Name Date Dose VIS Date Route   Pfizer COVID-19 Vaccine 09/10/2019 11:52 AM 0.3 mL 06/03/2019 Intramuscular   Manufacturer: [employer], [employer]   Lot: [id number]   NDC: [address]-6

## 2019-10-04 ENCOUNTER — Ambulatory Visit: Payer: BC Managed Care – PPO | Attending: Internal Medicine

## 2019-10-04 DIAGNOSIS — Z23 Encounter for immunization: Secondary | ICD-10-CM

## 2019-10-04 NOTE — Progress Notes (Signed)
   Covid-19 Vaccination Clinic  Name:  Maxey Ransom    MRN: [id number] DOB: 1966/04/19  10/04/2019  Mr. Dombek was observed post Covid-19 immunization for 15 minutes without incident. He was provided with Vaccine Information Sheet and instruction to access the V-Safe system.   Mr. Kloosterman was instructed to call 911 with any severe reactions post vaccine: Marland Kitchen Difficulty breathing  . Swelling of face and throat  . A fast heartbeat  . A bad rash all over body  . Dizziness and weakness   Immunizations Administered    Name Date Dose VIS Date Route   Pfizer COVID-19 Vaccine 10/04/2019  2:07 PM 0.3 mL 06/03/2019 Intramuscular   Manufacturer: [employer], [employer]   Lot: [id number]   NDC: [address]-0

## 2020-03-30 ENCOUNTER — Encounter (HOSPITAL_BASED_OUTPATIENT_CLINIC_OR_DEPARTMENT_OTHER): Payer: Self-pay | Admitting: *Deleted

## 2020-03-30 DIAGNOSIS — Y9289 Other specified places as the place of occurrence of the external cause: Secondary | ICD-10-CM | POA: Insufficient documentation

## 2020-03-30 DIAGNOSIS — N182 Chronic kidney disease, stage 2 (mild): Secondary | ICD-10-CM | POA: Diagnosis not present

## 2020-03-30 DIAGNOSIS — E1122 Type 2 diabetes mellitus with diabetic chronic kidney disease: Secondary | ICD-10-CM | POA: Insufficient documentation

## 2020-03-30 DIAGNOSIS — W010XXA Fall on same level from slipping, tripping and stumbling without subsequent striking against object, initial encounter: Secondary | ICD-10-CM | POA: Insufficient documentation

## 2020-03-30 DIAGNOSIS — I129 Hypertensive chronic kidney disease with stage 1 through stage 4 chronic kidney disease, or unspecified chronic kidney disease: Secondary | ICD-10-CM | POA: Insufficient documentation

## 2020-03-30 DIAGNOSIS — S4991XA Unspecified injury of right shoulder and upper arm, initial encounter: Secondary | ICD-10-CM | POA: Insufficient documentation

## 2020-03-30 DIAGNOSIS — E039 Hypothyroidism, unspecified: Secondary | ICD-10-CM | POA: Insufficient documentation

## 2020-03-31 DIAGNOSIS — S4991XA Unspecified injury of right shoulder and upper arm, initial encounter: Secondary | ICD-10-CM

## 2020-11-08 ENCOUNTER — Encounter: Payer: Self-pay | Admitting: Dermatology

## 2020-11-08 ENCOUNTER — Ambulatory Visit (INDEPENDENT_AMBULATORY_CARE_PROVIDER_SITE_OTHER): Payer: BC Managed Care – PPO | Admitting: Dermatology

## 2020-11-08 ENCOUNTER — Other Ambulatory Visit: Payer: Self-pay

## 2020-11-08 DIAGNOSIS — B360 Pityriasis versicolor: Secondary | ICD-10-CM

## 2020-11-08 LAB — POCT SKIN KOH

## 2020-11-08 MED ORDER — FLUCONAZOLE 100 MG PO TABS: ORAL_TABLET | ORAL | 0 refills | Status: DC

## 2020-11-22 NOTE — Progress Notes (Signed)
   Follow-Up Visit   Subjective  Juan Campos is a 55 y.o. male who presents for the following: Rash (On chest x several years-typically comes when it get hot- + itch tx- none).  Spots on chest for several years Location:  Duration:  Quality:  Associated Signs/Symptoms: Modifying Factors:  Severity:  Timing: Context:   Objective  Well appearing patient in no apparent distress; mood and affect are within normal limits. Objective  Left Breast: Branny pink scale, KOH positive.  Extensive involvement (of which the patient was unaware) on the back as well as some on the proximal arms and multiple spots on chest.    All skin waist up examined.   Assessment & Plan    Tinea versicolor Left Breast  We will treat with 3 doses of fluconazole at 2-week intervals; patient warned of the very rare possibility of an effect on the liver or skin (TEN). He is also encouraged to look for over-the-counter 1% ketoconazole shampoo (generic Nizoral) and to use this in the shower on his scalp 1 or 2 times weekly particularly in the warmer weather.  This may minimize recurrence.  fluconazole (DIFLUCAN) 100 MG tablet - Left Breast  POCT Skin KOH - Left Breast      I, Janalyn Harder, MD, have reviewed all documentation for this visit.  The documentation on 11/22/20 for the exam, diagnosis, procedures, and orders are all accurate and complete.

## 2020-12-11 ENCOUNTER — Ambulatory Visit: Payer: BC Managed Care – PPO | Admitting: Dermatology

## 2021-03-31 ENCOUNTER — Other Ambulatory Visit: Payer: Self-pay | Admitting: Dermatology

## 2021-03-31 DIAGNOSIS — B360 Pityriasis versicolor: Secondary | ICD-10-CM

## 2021-06-21 ENCOUNTER — Other Ambulatory Visit: Payer: Self-pay | Admitting: Dermatology

## 2021-06-21 DIAGNOSIS — B360 Pityriasis versicolor: Secondary | ICD-10-CM

## 2021-08-07 IMAGING — CR DG SHOULDER 2+V*R*
3 series · 3 of 3 positions shown · non-contrast
Comparison: None.

CLINICAL DATA: Fall with shoulder pain

EXAM:
RIGHT SHOULDER - 2+ VIEW

[w shoulder ap internal righ]
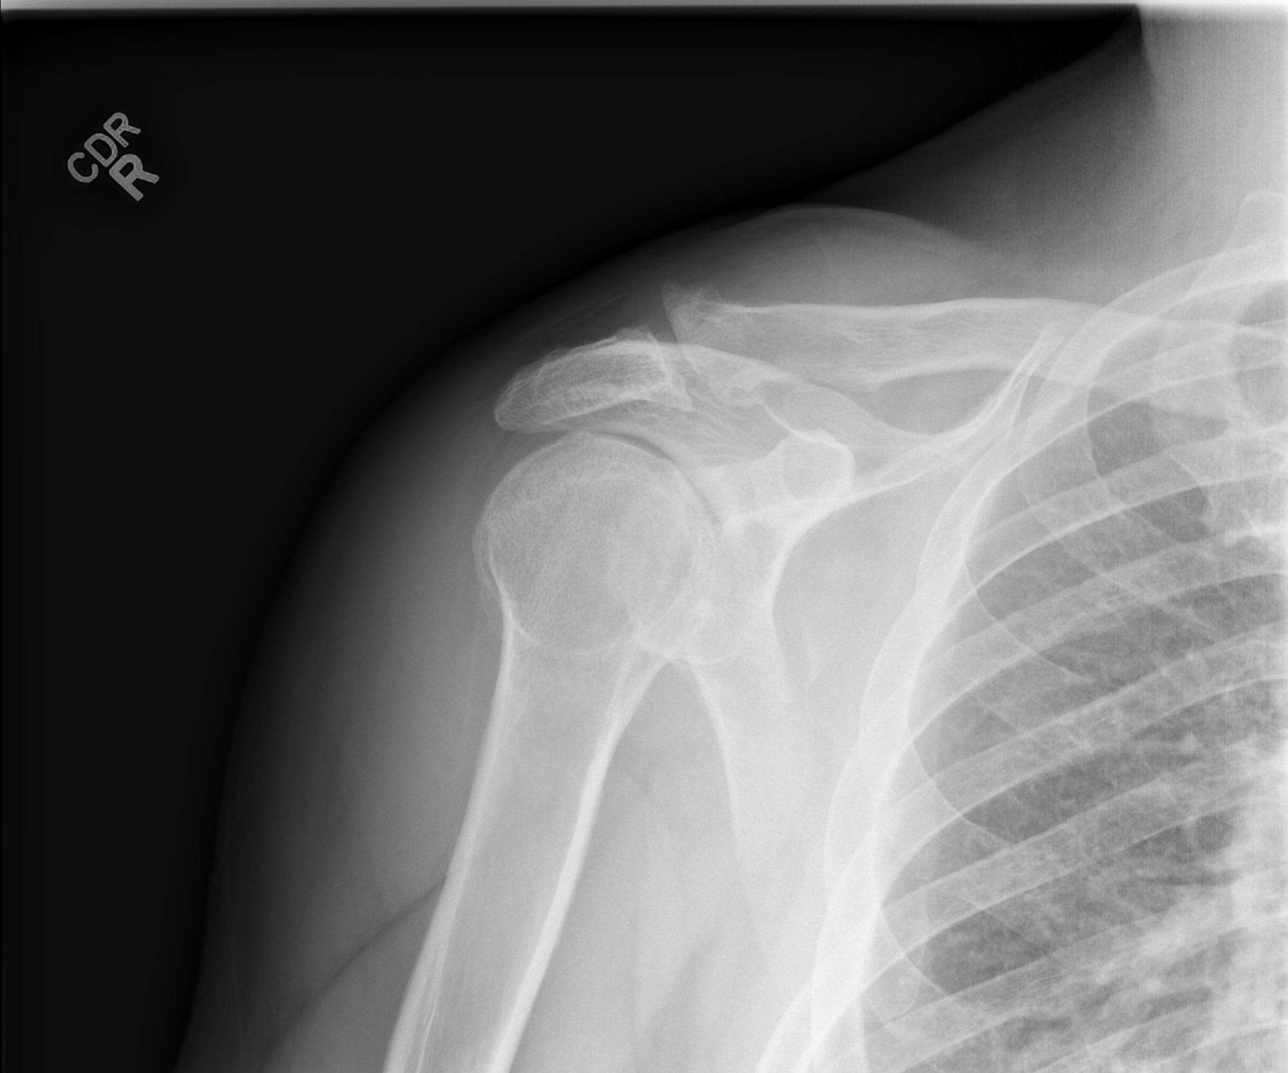

[w shoulder grashey right]
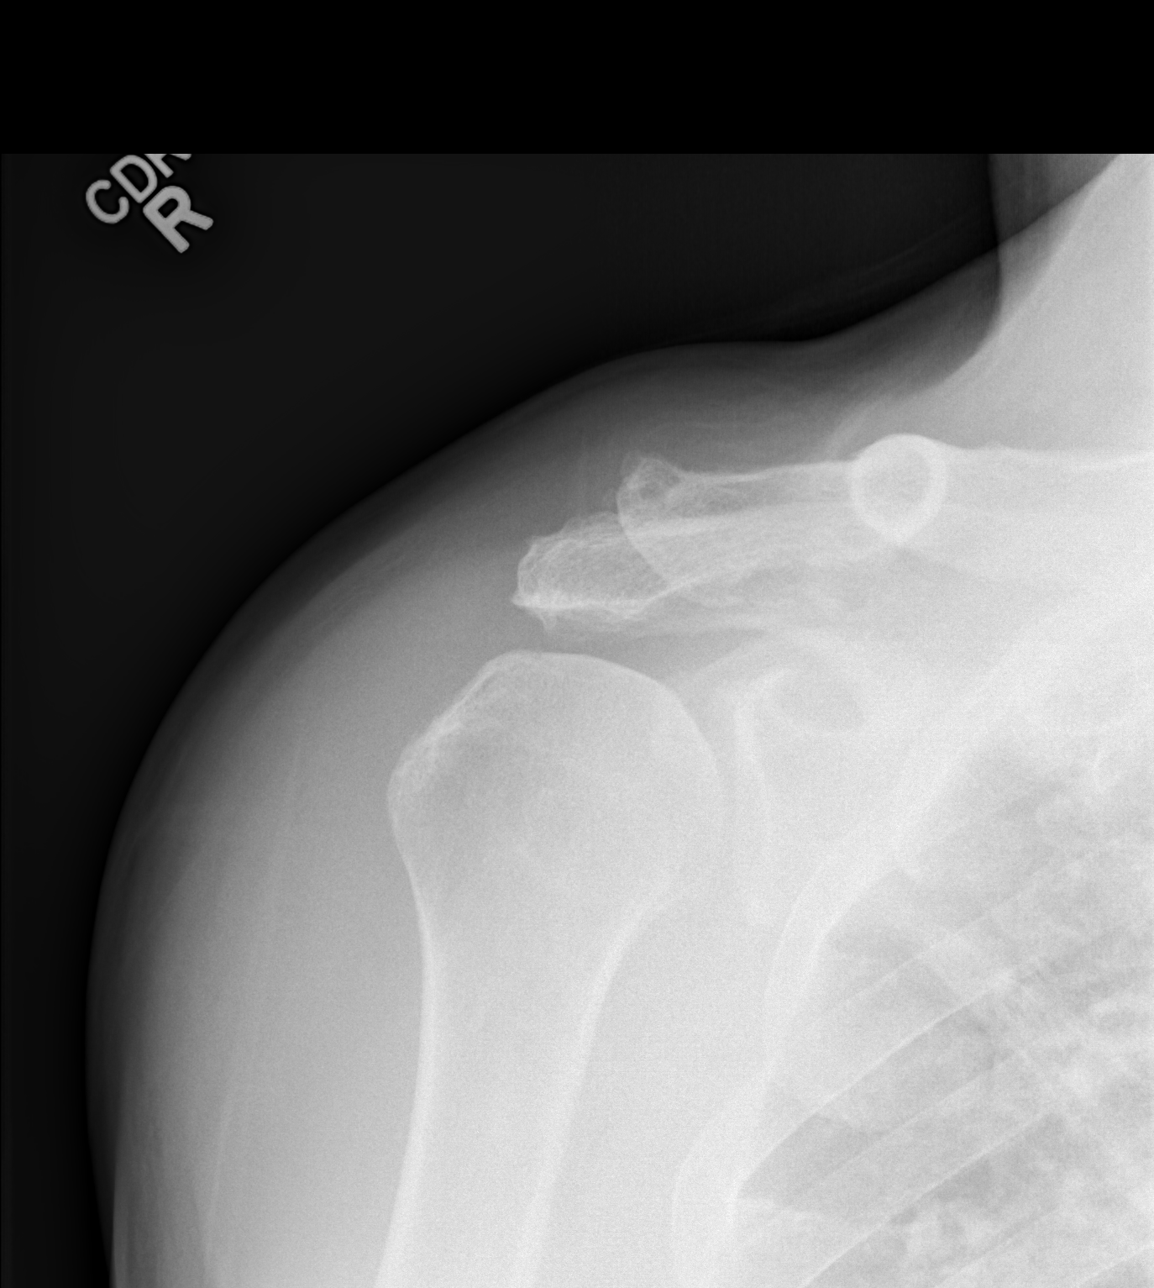

[w shoulder y view right *]
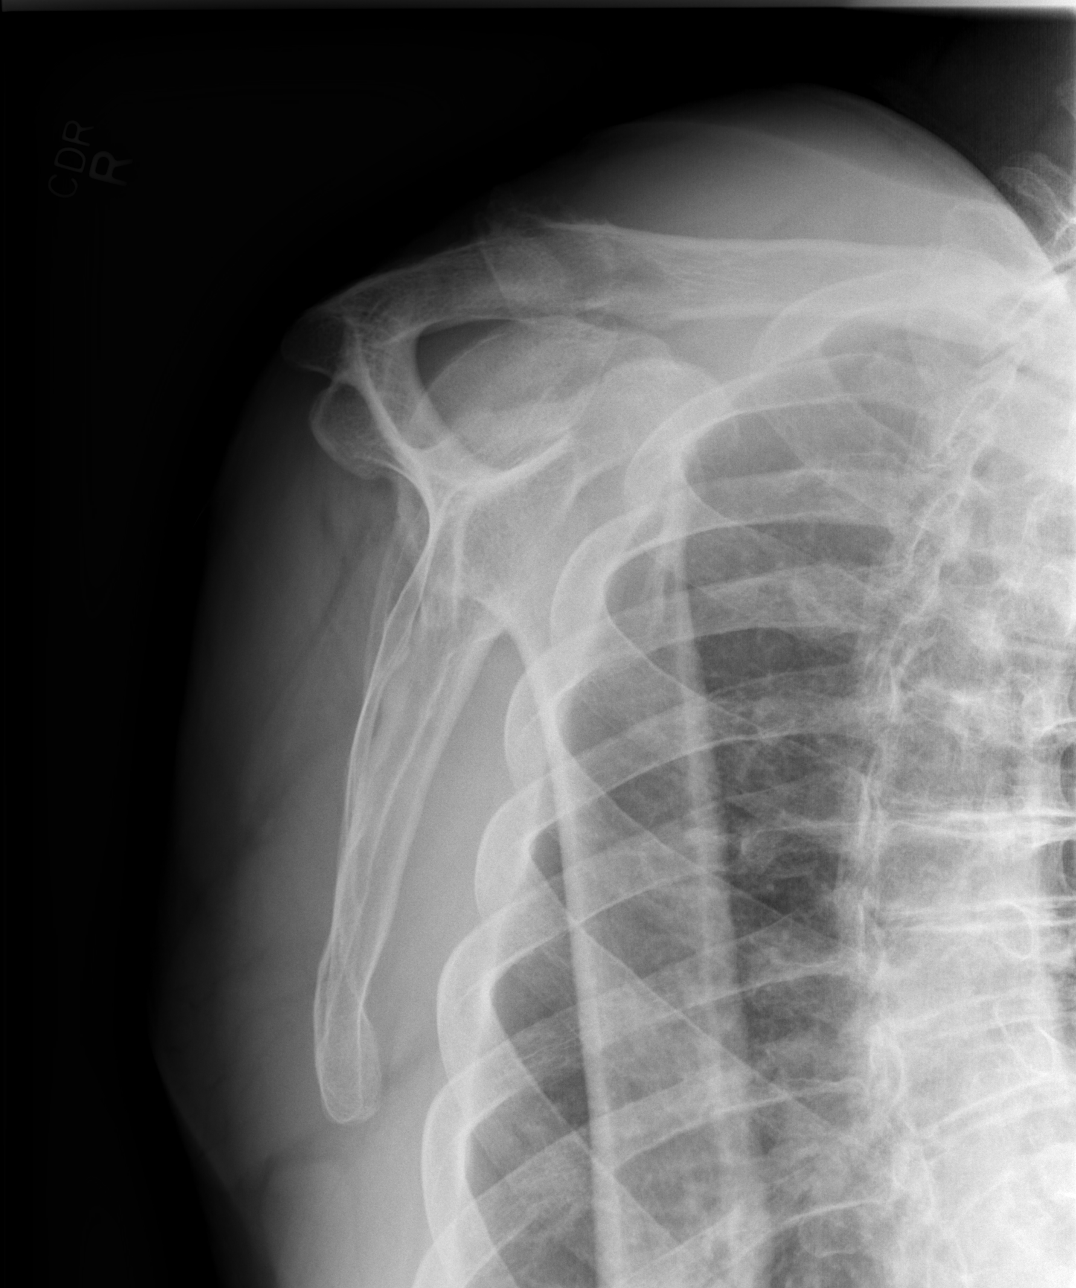

[3 of 3 positions shown; findings below may reference images not displayed]

FINDINGS: Moderate AC joint degenerative change. No acute displaced fracture
or malalignment. Narrowed appearance of subacromial space suggesting
rotator cuff disease.
IMPRESSION: No acute osseous abnormality.

## 2022-01-06 ENCOUNTER — Other Ambulatory Visit: Payer: Self-pay | Admitting: Internal Medicine

## 2022-01-06 DIAGNOSIS — R1011 Right upper quadrant pain: Secondary | ICD-10-CM

## 2022-01-06 DIAGNOSIS — R110 Nausea: Secondary | ICD-10-CM

## 2022-01-07 ENCOUNTER — Ambulatory Visit
Admission: RE | Admit: 2022-01-07 | Discharge: 2022-01-07 | Disposition: A | Discharge status: Home / Self Care | Payer: BC Managed Care – PPO | Source: Ambulatory Visit | Attending: Internal Medicine | Admitting: Internal Medicine

## 2022-01-07 DIAGNOSIS — R1011 Right upper quadrant pain: Secondary | ICD-10-CM

## 2022-01-07 DIAGNOSIS — R110 Nausea: Secondary | ICD-10-CM

## 2022-01-09 ENCOUNTER — Other Ambulatory Visit (HOSPITAL_COMMUNITY): Payer: Self-pay | Admitting: Gastroenterology

## 2022-01-09 DIAGNOSIS — R110 Nausea: Secondary | ICD-10-CM

## 2022-01-31 ENCOUNTER — Ambulatory Visit (HOSPITAL_COMMUNITY)
Admission: RE | Admit: 2022-01-31 | Discharge: 2022-01-31 | Disposition: A | Discharge status: Home / Self Care | Payer: BC Managed Care – PPO | Source: Ambulatory Visit | Attending: Gastroenterology | Admitting: Gastroenterology

## 2022-01-31 DIAGNOSIS — R110 Nausea: Secondary | ICD-10-CM | POA: Insufficient documentation

## 2022-01-31 MED ORDER — TECHNETIUM TC 99M SULFUR COLLOID
2.0000 | Freq: Once | INTRAVENOUS | Status: AC | PRN
  Administered 2022-01-31: 2 via ORAL

## 2022-02-05 ENCOUNTER — Encounter (HOSPITAL_COMMUNITY)
Admission: RE | Admit: 2022-02-05 | Discharge: 2022-02-05 | Disposition: A | Discharge status: Home / Self Care | Payer: BC Managed Care – PPO | Source: Ambulatory Visit | Attending: Gastroenterology | Admitting: Gastroenterology

## 2022-02-05 DIAGNOSIS — R110 Nausea: Secondary | ICD-10-CM | POA: Insufficient documentation

## 2022-02-05 MED ORDER — TECHNETIUM TC 99M MEBROFENIN IV KIT
5.3000 | PACK | Freq: Once | INTRAVENOUS | Status: AC | PRN
  Administered 2022-02-05: 5.3 via INTRAVENOUS

## 2022-03-27 ENCOUNTER — Other Ambulatory Visit: Payer: Self-pay | Admitting: Chiropractor

## 2022-04-07 ENCOUNTER — Other Ambulatory Visit: Payer: Self-pay | Admitting: Chiropractor

## 2022-04-07 DIAGNOSIS — R200 Anesthesia of skin: Secondary | ICD-10-CM

## 2022-04-09 ENCOUNTER — Ambulatory Visit
Admission: RE | Admit: 2022-04-09 | Discharge: 2022-04-09 | Disposition: A | Discharge status: Home / Self Care | Payer: BC Managed Care – PPO | Source: Ambulatory Visit | Attending: Chiropractor | Admitting: Chiropractor

## 2022-04-09 DIAGNOSIS — R200 Anesthesia of skin: Secondary | ICD-10-CM

## 2022-06-09 ENCOUNTER — Encounter (HOSPITAL_BASED_OUTPATIENT_CLINIC_OR_DEPARTMENT_OTHER): Payer: Self-pay

## 2022-06-09 ENCOUNTER — Emergency Department (HOSPITAL_BASED_OUTPATIENT_CLINIC_OR_DEPARTMENT_OTHER)
Admission: EM | Admit: 2022-06-09 | Discharge: 2022-06-09 | Disposition: A | Discharge status: Home / Self Care | Payer: BC Managed Care – PPO | Attending: Emergency Medicine | Admitting: Emergency Medicine

## 2022-06-09 ENCOUNTER — Other Ambulatory Visit: Payer: Self-pay

## 2022-06-09 DIAGNOSIS — L539 Erythematous condition, unspecified: Secondary | ICD-10-CM | POA: Insufficient documentation

## 2022-06-09 DIAGNOSIS — Z79899 Other long term (current) drug therapy: Secondary | ICD-10-CM | POA: Insufficient documentation

## 2022-06-09 DIAGNOSIS — N189 Chronic kidney disease, unspecified: Secondary | ICD-10-CM | POA: Diagnosis not present

## 2022-06-09 DIAGNOSIS — Z7984 Long term (current) use of oral hypoglycemic drugs: Secondary | ICD-10-CM | POA: Insufficient documentation

## 2022-06-09 DIAGNOSIS — R0981 Nasal congestion: Secondary | ICD-10-CM | POA: Diagnosis present

## 2022-06-09 DIAGNOSIS — U071 COVID-19: Secondary | ICD-10-CM | POA: Diagnosis not present

## 2022-06-09 DIAGNOSIS — I129 Hypertensive chronic kidney disease with stage 1 through stage 4 chronic kidney disease, or unspecified chronic kidney disease: Secondary | ICD-10-CM | POA: Insufficient documentation

## 2022-06-09 DIAGNOSIS — E1122 Type 2 diabetes mellitus with diabetic chronic kidney disease: Secondary | ICD-10-CM | POA: Insufficient documentation

## 2022-06-09 LAB — RESP PANEL BY RT-PCR (RSV, FLU A&B, COVID)  RVPGX2
Influenza A by PCR: NEGATIVE
Influenza B by PCR: NEGATIVE
Resp Syncytial Virus by PCR: NEGATIVE
SARS Coronavirus 2 by RT PCR: POSITIVE — AB

## 2022-06-09 MED ORDER — IBUPROFEN 400 MG PO TABS
600.0000 mg | ORAL_TABLET | Freq: Once | ORAL | Status: AC
  Administered 2022-06-09: 600 mg via ORAL
  Filled 2022-06-09: qty 1

## 2022-06-09 MED ORDER — MOLNUPIRAVIR EUA 200MG CAPSULE: 4.0000 | ORAL_CAPSULE | Freq: Two times a day (BID) | ORAL | 0 refills | Status: AC

## 2022-06-09 NOTE — ED Provider Notes (Signed)
[facility] EMERGENCY DEPT Provider Note   CSN: [id number] Arrival date & time: 06/09/22  2992     History {Add pertinent medical, surgical, social history, OB history to HPI:1} Chief Complaint  Patient presents with   Influenza    Juan Campos is a 56 y.o. male.   Influenza      Home Medications Prior to Admission medications   Medication Sig Start Date End Date Taking? Authorizing Provider  ALPRAZolam Prudy Feeler) 1 MG tablet TAKE 1/2-1 TABLET BY MOUTH 3 TIMES A DAY AS NEEDED FOR ANXIETY 08/17/15   Lucky Cowboy, MD  atenolol (TENORMIN) 100 MG tablet TAKE 1 TABLET BY MOUTH EVERY DAY FOR BLOOD PRESSURE 06/08/15   Doree Albee, PA-C  atorvastatin (LIPITOR) 80 MG tablet Take 1 tablet (80 mg total) by mouth daily. Patient not taking: Reported on 11/08/2020 12/07/14   Lucky Cowboy, MD  buPROPion (WELLBUTRIN XL) 300 MG 24 hr tablet TAKE 1 TAB BY MOUTH DAILY. Patient not taking: Reported on 11/08/2020 09/04/15   Lucky Cowboy, MD  busPIRone (BUSPAR) 10 MG tablet Take 10 mg by mouth 2 (two) times daily. 04/14/22   [provider]  citalopram (CELEXA) 40 MG tablet TAKE 1 TABLET DAILY FOR MOOD Patient not taking: Reported on 11/08/2020 09/16/15   Lucky Cowboy, MD  fenofibrate micronized (LOFIBRA) 134 MG capsule TAKE ONE CAPSULE BY MOUTH EVERY DAY 09/04/15   Lucky Cowboy, MD  fluconazole (DIFLUCAN) 100 MG tablet TAKE 2 TABLETS BY MOUTH EVERY 2 WEEKS 04/01/21   Janalyn Harder, MD  glucose blood (FREESTYLE LITE) test strip Check blood sugar 1 time a day.  [id number] 09/12/14   Lucky Cowboy, MD  JARDIANCE 25 MG TABS tablet Take 25 mg by mouth daily. 05/04/22   [provider]  levothyroxine (SYNTHROID, LEVOTHROID) 100 MCG tablet TAKE 1 TABLET BY MOUTH EVERY DAY OR AS DIRECTED 06/08/15   Doree Albee, PA-C  metFORMIN (GLUCOPHAGE-XR) 500 MG 24 hr tablet TAKE 1 TABLET BY MOUTH DAILY WITH BREAKFAST & LUNCH AND 2 TABLETS WITH SUPPER 12/12/14    Lucky Cowboy, MD  phentermine (ADIPEX-P) 37.5 MG tablet TAKE 1/2 TO 1 TABLET BY MOUTH EVERY MORNING FOR DIET Patient not taking: Reported on 11/08/2020 09/04/15   Lucky Cowboy, MD  rosuvastatin (CRESTOR) 20 MG tablet Take 20 mg by mouth daily.    [provider]  Semaglutide (OZEMPIC, 0.25 OR 0.5 MG/DOSE, [place]) Inject into the skin.    [provider]  tadalafil (CIALIS) 20 MG tablet Take 1 tablet (20 mg total) by mouth daily as needed for erectile dysfunction. Patient not taking: Reported on 11/08/2020 04/26/15   Lucky Cowboy, MD  Vitamin D, Ergocalciferol, (DRISDOL) 50000 units CAPS capsule TAKE 1 CAPSULE DAILY OR AS DIRECTED FOR SEVERE VITAMIN D DEFICIENCY 09/04/15   Lucky Cowboy, MD  vortioxetine HBr (TRINTELLIX) 20 MG TABS tablet Take 20 mg by mouth daily.    [provider]      Allergies    Citalopram and Sertraline    Review of Systems   Review of Systems  Physical Exam Updated Vital Signs BP 110/88 (BP Location: Left Arm)   Pulse 100   Temp 99.2 F (37.3 C)   Resp 18   Ht 5\' 8"  (1.727 m)   Wt 111.9 kg   SpO2 100%   BMI 37.51 kg/m  Physical Exam  ED Results / Procedures / Treatments   Labs (all labs ordered are listed, but only abnormal results are displayed) Labs Reviewed  RESP PANEL BY RT-PCR (RSV, FLU A&B, COVID)  [id number] - Abnormal; Notable for the following components:      Result Value   SARS Coronavirus 2 by RT PCR POSITIVE (*)    All other components within normal limits    EKG None  Radiology No results found.  Procedures Procedures  {Document cardiac monitor, telemetry assessment procedure when appropriate:1}  Medications Ordered in ED Medications - No data to display  ED Course/ Medical Decision Making/ A&P Clinical Course as of 06/09/22 1136  Mon Jun 09, 2022  1127 Resp panel by RT-PCR (RSV, Flu A&B, Covid) Anterior Nasal Swab(!) [JR]  1127 SARS Coronavirus 2 by RT PCR(!): POSITIVE [JR]    Clinical  Course User Index [JR] Gareth Eagle, PA-C                           Medical Decision Making Amount and/or Complexity of Data Reviewed Labs:  Decision-making details documented in ED Course.   ***  {Document critical care time when appropriate:1} {Document review of labs and clinical decision tools ie heart score, Chads2Vasc2 etc:1}  {Document your independent review of radiology images, and any outside records:1} {Document your discussion with family members, caretakers, and with consultants:1} {Document social determinants of health affecting pt's care:1} {Document your decision making why or why not admission, treatments were needed:1} Final Clinical Impression(s) / ED Diagnoses Final diagnoses:  None    Rx / DC Orders ED Discharge Orders     None

## 2022-06-09 NOTE — Discharge Instructions (Signed)
Evaluation today revealed that you do have COVID.  I am treating you with molnupiravir which is an antiviral for your COVID infection.  Also recommend that you follow-up with your PCP if your symptoms persist.  If you have worsening shortness of breath, chest pain, uncontrolled fever, worsening fatigue please return to the emergency department for further evaluation.

## 2022-06-09 NOTE — ED Triage Notes (Signed)
Pt c/o cough and headache.States feels bad and tired.

## 2023-07-07 DIAGNOSIS — M9903 Segmental and somatic dysfunction of lumbar region: Secondary | ICD-10-CM | POA: Diagnosis not present

## 2023-07-07 DIAGNOSIS — M9905 Segmental and somatic dysfunction of pelvic region: Secondary | ICD-10-CM | POA: Diagnosis not present

## 2023-07-07 DIAGNOSIS — M5441 Lumbago with sciatica, right side: Secondary | ICD-10-CM | POA: Diagnosis not present

## 2023-08-10 DIAGNOSIS — M9903 Segmental and somatic dysfunction of lumbar region: Secondary | ICD-10-CM | POA: Diagnosis not present

## 2023-08-10 DIAGNOSIS — M5441 Lumbago with sciatica, right side: Secondary | ICD-10-CM | POA: Diagnosis not present

## 2023-08-10 DIAGNOSIS — M9905 Segmental and somatic dysfunction of pelvic region: Secondary | ICD-10-CM | POA: Diagnosis not present

## 2023-10-14 DIAGNOSIS — M9903 Segmental and somatic dysfunction of lumbar region: Secondary | ICD-10-CM | POA: Diagnosis not present

## 2023-10-14 DIAGNOSIS — M9901 Segmental and somatic dysfunction of cervical region: Secondary | ICD-10-CM | POA: Diagnosis not present

## 2023-10-14 DIAGNOSIS — M9905 Segmental and somatic dysfunction of pelvic region: Secondary | ICD-10-CM | POA: Diagnosis not present

## 2023-10-14 DIAGNOSIS — M9902 Segmental and somatic dysfunction of thoracic region: Secondary | ICD-10-CM | POA: Diagnosis not present

## 2023-10-14 DIAGNOSIS — M9907 Segmental and somatic dysfunction of upper extremity: Secondary | ICD-10-CM | POA: Diagnosis not present

## 2023-10-21 DIAGNOSIS — M9905 Segmental and somatic dysfunction of pelvic region: Secondary | ICD-10-CM | POA: Diagnosis not present

## 2023-10-21 DIAGNOSIS — M9901 Segmental and somatic dysfunction of cervical region: Secondary | ICD-10-CM | POA: Diagnosis not present

## 2023-10-21 DIAGNOSIS — M9907 Segmental and somatic dysfunction of upper extremity: Secondary | ICD-10-CM | POA: Diagnosis not present

## 2023-10-21 DIAGNOSIS — M9903 Segmental and somatic dysfunction of lumbar region: Secondary | ICD-10-CM | POA: Diagnosis not present

## 2023-10-26 DIAGNOSIS — M9907 Segmental and somatic dysfunction of upper extremity: Secondary | ICD-10-CM | POA: Diagnosis not present

## 2023-10-26 DIAGNOSIS — M9901 Segmental and somatic dysfunction of cervical region: Secondary | ICD-10-CM | POA: Diagnosis not present

## 2023-10-26 DIAGNOSIS — M9903 Segmental and somatic dysfunction of lumbar region: Secondary | ICD-10-CM | POA: Diagnosis not present

## 2023-10-26 DIAGNOSIS — M9902 Segmental and somatic dysfunction of thoracic region: Secondary | ICD-10-CM | POA: Diagnosis not present

## 2023-10-26 DIAGNOSIS — M9905 Segmental and somatic dysfunction of pelvic region: Secondary | ICD-10-CM | POA: Diagnosis not present

## 2023-10-28 DIAGNOSIS — M9901 Segmental and somatic dysfunction of cervical region: Secondary | ICD-10-CM | POA: Diagnosis not present

## 2023-10-28 DIAGNOSIS — M9907 Segmental and somatic dysfunction of upper extremity: Secondary | ICD-10-CM | POA: Diagnosis not present

## 2023-10-28 DIAGNOSIS — M9905 Segmental and somatic dysfunction of pelvic region: Secondary | ICD-10-CM | POA: Diagnosis not present

## 2023-10-28 DIAGNOSIS — M9902 Segmental and somatic dysfunction of thoracic region: Secondary | ICD-10-CM | POA: Diagnosis not present

## 2023-10-28 DIAGNOSIS — M9903 Segmental and somatic dysfunction of lumbar region: Secondary | ICD-10-CM | POA: Diagnosis not present

## 2023-11-10 DIAGNOSIS — E1169 Type 2 diabetes mellitus with other specified complication: Secondary | ICD-10-CM | POA: Diagnosis not present

## 2023-11-10 DIAGNOSIS — E039 Hypothyroidism, unspecified: Secondary | ICD-10-CM | POA: Diagnosis not present

## 2023-11-10 DIAGNOSIS — E119 Type 2 diabetes mellitus without complications: Secondary | ICD-10-CM | POA: Diagnosis not present

## 2023-11-10 DIAGNOSIS — Z1331 Encounter for screening for depression: Secondary | ICD-10-CM | POA: Diagnosis not present

## 2023-11-10 DIAGNOSIS — I152 Hypertension secondary to endocrine disorders: Secondary | ICD-10-CM | POA: Diagnosis not present

## 2023-11-10 DIAGNOSIS — E1159 Type 2 diabetes mellitus with other circulatory complications: Secondary | ICD-10-CM | POA: Diagnosis not present

## 2023-11-10 DIAGNOSIS — E785 Hyperlipidemia, unspecified: Secondary | ICD-10-CM | POA: Diagnosis not present

## 2023-11-18 DIAGNOSIS — M9902 Segmental and somatic dysfunction of thoracic region: Secondary | ICD-10-CM | POA: Diagnosis not present

## 2023-11-18 DIAGNOSIS — M9905 Segmental and somatic dysfunction of pelvic region: Secondary | ICD-10-CM | POA: Diagnosis not present

## 2023-11-18 DIAGNOSIS — M9903 Segmental and somatic dysfunction of lumbar region: Secondary | ICD-10-CM | POA: Diagnosis not present

## 2023-11-18 DIAGNOSIS — M9901 Segmental and somatic dysfunction of cervical region: Secondary | ICD-10-CM | POA: Diagnosis not present

## 2023-11-18 DIAGNOSIS — M9907 Segmental and somatic dysfunction of upper extremity: Secondary | ICD-10-CM | POA: Diagnosis not present

## 2023-12-08 DIAGNOSIS — M9905 Segmental and somatic dysfunction of pelvic region: Secondary | ICD-10-CM | POA: Diagnosis not present

## 2023-12-08 DIAGNOSIS — M9907 Segmental and somatic dysfunction of upper extremity: Secondary | ICD-10-CM | POA: Diagnosis not present

## 2023-12-08 DIAGNOSIS — M9901 Segmental and somatic dysfunction of cervical region: Secondary | ICD-10-CM | POA: Diagnosis not present

## 2023-12-08 DIAGNOSIS — M9902 Segmental and somatic dysfunction of thoracic region: Secondary | ICD-10-CM | POA: Diagnosis not present

## 2023-12-08 DIAGNOSIS — M9903 Segmental and somatic dysfunction of lumbar region: Secondary | ICD-10-CM | POA: Diagnosis not present

## 2024-01-08 DIAGNOSIS — I10 Essential (primary) hypertension: Secondary | ICD-10-CM | POA: Diagnosis not present

## 2024-01-08 DIAGNOSIS — E1122 Type 2 diabetes mellitus with diabetic chronic kidney disease: Secondary | ICD-10-CM | POA: Diagnosis not present

## 2024-01-08 DIAGNOSIS — E559 Vitamin D deficiency, unspecified: Secondary | ICD-10-CM | POA: Diagnosis not present

## 2024-03-25 ENCOUNTER — Ambulatory Visit

## 2024-03-25 DIAGNOSIS — Z23 Encounter for immunization: Secondary | ICD-10-CM | POA: Diagnosis not present

## 2024-04-13 DIAGNOSIS — M9907 Segmental and somatic dysfunction of upper extremity: Secondary | ICD-10-CM | POA: Diagnosis not present

## 2024-04-13 DIAGNOSIS — M9901 Segmental and somatic dysfunction of cervical region: Secondary | ICD-10-CM | POA: Diagnosis not present

## 2024-04-13 DIAGNOSIS — M9902 Segmental and somatic dysfunction of thoracic region: Secondary | ICD-10-CM | POA: Diagnosis not present

## 2024-04-13 DIAGNOSIS — M9903 Segmental and somatic dysfunction of lumbar region: Secondary | ICD-10-CM | POA: Diagnosis not present

## 2024-04-13 DIAGNOSIS — M9905 Segmental and somatic dysfunction of pelvic region: Secondary | ICD-10-CM | POA: Diagnosis not present

## 2024-04-20 DIAGNOSIS — R0981 Nasal congestion: Secondary | ICD-10-CM | POA: Diagnosis not present

## 2024-04-20 DIAGNOSIS — J069 Acute upper respiratory infection, unspecified: Secondary | ICD-10-CM | POA: Diagnosis not present

## 2024-05-09 DIAGNOSIS — H52203 Unspecified astigmatism, bilateral: Secondary | ICD-10-CM | POA: Diagnosis not present

## 2024-05-09 DIAGNOSIS — E119 Type 2 diabetes mellitus without complications: Secondary | ICD-10-CM | POA: Diagnosis not present

## 2024-05-09 DIAGNOSIS — H5203 Hypermetropia, bilateral: Secondary | ICD-10-CM | POA: Diagnosis not present

## 2024-05-11 DIAGNOSIS — M9905 Segmental and somatic dysfunction of pelvic region: Secondary | ICD-10-CM | POA: Diagnosis not present

## 2024-05-11 DIAGNOSIS — M9907 Segmental and somatic dysfunction of upper extremity: Secondary | ICD-10-CM | POA: Diagnosis not present

## 2024-05-11 DIAGNOSIS — M9901 Segmental and somatic dysfunction of cervical region: Secondary | ICD-10-CM | POA: Diagnosis not present

## 2024-05-11 DIAGNOSIS — M9903 Segmental and somatic dysfunction of lumbar region: Secondary | ICD-10-CM | POA: Diagnosis not present

## 2024-05-12 DIAGNOSIS — E890 Postprocedural hypothyroidism: Secondary | ICD-10-CM | POA: Diagnosis not present

## 2024-05-12 DIAGNOSIS — E1159 Type 2 diabetes mellitus with other circulatory complications: Secondary | ICD-10-CM | POA: Diagnosis not present

## 2024-05-12 DIAGNOSIS — E119 Type 2 diabetes mellitus without complications: Secondary | ICD-10-CM | POA: Diagnosis not present

## 2024-05-12 DIAGNOSIS — I152 Hypertension secondary to endocrine disorders: Secondary | ICD-10-CM | POA: Diagnosis not present

## 2024-05-12 DIAGNOSIS — E785 Hyperlipidemia, unspecified: Secondary | ICD-10-CM | POA: Diagnosis not present

## 2024-05-12 DIAGNOSIS — E1169 Type 2 diabetes mellitus with other specified complication: Secondary | ICD-10-CM | POA: Diagnosis not present

## 2024-06-08 DIAGNOSIS — M9905 Segmental and somatic dysfunction of pelvic region: Secondary | ICD-10-CM | POA: Diagnosis not present

## 2024-06-08 DIAGNOSIS — M9903 Segmental and somatic dysfunction of lumbar region: Secondary | ICD-10-CM | POA: Diagnosis not present

## 2024-06-08 DIAGNOSIS — M9907 Segmental and somatic dysfunction of upper extremity: Secondary | ICD-10-CM | POA: Diagnosis not present

## 2024-06-08 DIAGNOSIS — M9901 Segmental and somatic dysfunction of cervical region: Secondary | ICD-10-CM | POA: Diagnosis not present
